# Patient Record
Sex: Female | Born: 1985
Health system: Southern US, Community
[De-identification: ages and names within clinical notes are randomized; demographics above are authoritative.]

## PROBLEM LIST (undated history)

## (undated) DIAGNOSIS — D649 Anemia, unspecified: Secondary | ICD-10-CM

## (undated) HISTORY — DX: Anemia, unspecified: D64.9

## (undated) HISTORY — PX: NO PAST SURGERIES: SHX2092

---

## 2019-09-14 ENCOUNTER — Encounter: Payer: Self-pay | Admitting: Medical

## 2019-09-14 ENCOUNTER — Other Ambulatory Visit (HOSPITAL_COMMUNITY)
Admission: RE | Admit: 2019-09-14 | Discharge: 2019-09-14 | Disposition: A | Discharge status: Home / Self Care | Payer: Medicaid Other | Source: Ambulatory Visit | Attending: Medical | Admitting: Medical

## 2019-09-14 ENCOUNTER — Other Ambulatory Visit: Payer: Self-pay

## 2019-09-14 ENCOUNTER — Ambulatory Visit (INDEPENDENT_AMBULATORY_CARE_PROVIDER_SITE_OTHER): Payer: Medicaid Other | Admitting: Medical

## 2019-09-14 VITALS — BP 116/77 | HR 89 | Ht 63.0 in | Wt 178.8 lb

## 2019-09-14 DIAGNOSIS — Z23 Encounter for immunization: Secondary | ICD-10-CM

## 2019-09-14 DIAGNOSIS — S2231XG Fracture of one rib, right side, subsequent encounter for fracture with delayed healing: Secondary | ICD-10-CM | POA: Diagnosis not present

## 2019-09-14 DIAGNOSIS — Z3689 Encounter for other specified antenatal screening: Secondary | ICD-10-CM | POA: Diagnosis not present

## 2019-09-14 DIAGNOSIS — Z3492 Encounter for supervision of normal pregnancy, unspecified, second trimester: Secondary | ICD-10-CM | POA: Diagnosis not present

## 2019-09-14 DIAGNOSIS — Z3480 Encounter for supervision of other normal pregnancy, unspecified trimester: Secondary | ICD-10-CM

## 2019-09-14 DIAGNOSIS — Z3A16 16 weeks gestation of pregnancy: Secondary | ICD-10-CM

## 2019-09-14 DIAGNOSIS — S2239XA Fracture of one rib, unspecified side, initial encounter for closed fracture: Secondary | ICD-10-CM | POA: Insufficient documentation

## 2019-09-14 DIAGNOSIS — S2231XD Fracture of one rib, right side, subsequent encounter for fracture with routine healing: Secondary | ICD-10-CM

## 2019-09-14 MED ORDER — BLOOD PRESSURE KIT DEVI: 1.0000 | Freq: Every day | 0 refills | Status: AC

## 2019-09-14 MED ORDER — PRENATAL VITAMIN 27-0.8 MG PO TABS: 1.0000 | ORAL_TABLET | Freq: Every day | ORAL | 11 refills | Status: AC

## 2019-09-14 NOTE — Progress Notes (Signed)
   PRENATAL VISIT NOTE  Subjective:  Kristen Perez is a 34 y.o. [phone] at 91w1dbeing seen today for her first prenatal visit for this pregnancy.  She is currently monitored for the following issues for this low-risk pregnancy and has Supervision of other normal pregnancy, antepartum and Rib fracture on their problem list.  Patient reports no complaints.  Contractions: Not present. Vag. Bleeding: None.  Movement: Absent. Denies leaking of fluid.   She is planning to breastfeed. Desires contraception, unsure what type  The following portions of the patient's history were reviewed and updated as appropriate: allergies, current medications, past family history, past medical history, past social history, past surgical history and problem list.   Objective:   Vitals:   09/14/19 0915 09/14/19 0916  BP: 116/77   Pulse: 89   Weight: 178 lb 12.8 oz (81.1 kg)   Height:  _0  (1.6 m)    Fetal Status: Fetal Heart Rate (bpm): 151   Movement: Absent     General:  Alert, oriented and cooperative. Patient is in no acute distress.  Skin: Skin is warm and dry. No rash noted.   Cardiovascular: Normal heart rate and rhythm noted  Respiratory: Normal respiratory effort, no problems with respiration noted. Clear to auscultation.   Abdomen: Soft, gravid, appropriate for gestational age. Normal bowel sounds. Non-tender. Pain/Pressure: Absent     Pelvic: Cervical exam deferred        Extremities: Normal range of motion.  Edema: None  Mental Status: Normal mood and affect. Normal behavior. Normal judgment and thought content.   Assessment and Plan:  Pregnancy: [id number] [redacted]w[redacted]d. Encounter for supervision of low-risk pregnancy in second trimester - Obstetric Panel, Including HIV - Genetic Screening - Hemoglobin A1c - GC/Chlamydia probe amp ([place])[facility] at [facility] AM[facility] BABYSCRIPTS SCHEDULE OPTIMIZATION - Culture, OB Urine - Blood Pressure Monitoring (BLOOD PRESSURE KIT) DEVI; 1 Device by  Does not apply route daily. ICD 10: Z34.00  Dispense: 1 each; Refill: 0 - Flu Vaccine QUAD 36+ mos IM - Prenatal Vit-Fe Fumarate-FA (PRENATAL VITAMIN) 27-0.8 MG TABS; Take 1 tablet by mouth daily.  Dispense: 30 tablet; Refill: 11 - AFP, Serum, Open Spina Bifida  2. Encounter for fetal anatomic survey - [place] OB COMP + 14 WK; Future  4. Closed fracture of one rib of right side with routine healing, subsequent encounter - Injury occurred in 2018. Patient continues to have intermittent pain at the site.   The nature of our practice with multiple providers was discussed  The care team including students, residents, APPs, and Physicians was discussed  The hospital call schedule for deliveries was discussed   Preterm labor symptoms and general obstetric precautions including but not limited to vaginal bleeding, contractions, leaking of fluid and fetal movement were reviewed in detail with the patient. Please refer to After Visit Summary for other counseling recommendations.   Return in about 4 weeks (around 10/12/2019) for LOB, Virtual.  Future Appointments  Date Time Provider DeMount Sterling3/23/2021 11:15 AM [facility] [place]  JuKerry HoughPA-C

## 2019-09-14 NOTE — Patient Instructions (Addendum)
Safe Medications in Pregnancy   Acne:  Benzoyl Peroxide  Salicylic Acid   Backache/Headache:  Tylenol: 2 regular strength every 4 hours OR        2 Extra strength every 6 hours   Colds/Coughs/Allergies:  Benadryl (alcohol free) 25 mg every 6 hours as needed  Breath right strips  Claritin  Cepacol throat lozenges  Chloraseptic throat spray  Cold-Eeze- up to three times per day  Cough drops, alcohol free  Flonase (by prescription only)  Guaifenesin  Mucinex  Robitussin DM (plain only, alcohol free)  Saline nasal spray/drops  Sudafed (pseudoephedrine) & Actifed * use only after [redacted] weeks gestation and if you do not have high blood pressure  Tylenol  Vicks Vaporub  Zinc lozenges  Zyrtec   Constipation:  Colace  Ducolax suppositories  Fleet enema  Glycerin suppositories  Metamucil  Milk of magnesia  Miralax  Senokot  Smooth move tea   Diarrhea:  Kaopectate  Imodium A-D   *NO pepto Bismol   Hemorrhoids:  Anusol  Anusol HC  Preparation H  Tucks   Indigestion:  Tums  Maalox  Mylanta  Zantac  Pepcid   Insomnia:  Benadryl (alcohol free) 25mg  every 6 hours as needed  Tylenol PM  Unisom, no Gelcaps   Leg Cramps:  Tums  MagGel   Nausea/Vomiting:  Bonine  Dramamine  Emetrol  Ginger extract  Sea bands  Meclizine  Nausea medication to take during pregnancy:  Unisom (doxylamine succinate 25 mg tablets) Take one tablet daily at bedtime. If symptoms are not adequately controlled, the dose can be increased to a maximum recommended dose of two tablets daily (1/2 tablet in the morning, 1/2 tablet mid-afternoon and one at bedtime).  Vitamin B6 100mg  tablets. Take one tablet twice a day (up to 200 mg per day).   Skin Rashes:  Aveeno products  Benadryl cream or 25mg  every 6 hours as needed  Calamine Lotion  1% cortisone cream   Yeast infection:  Gyne-lotrimin 7  Monistat 7    **If taking multiple medications, please check labels to avoid  duplicating the same active ingredients  **take medication as directed on the label  ** Do not exceed 4000 mg of tylenol in 24 hours  **Do not take medications that contain aspirin or ibuprofen         AREA PEDIATRIC/FAMILY PRACTICE PHYSICIANS  [place] ( ) . [facility] [place]; [id number], MD; [facility], MD; Melodie Bouillon, MD; McDiarmid, MD; Lum Babe, MD; Sheffield Slider, MD; Leveda Anna, MD o [address] [place] [place], [address] [place] o [phone] o Mon-Fri 8:30-12:30, 1:30-5:00 o Providers come to see babies at [facility] o Accepting Medicaid . [facility] [place] o Limited providers who accept newborns: [phone], MD; [facility], MD; [place], MD o [address], [place], Paulino Rily [address] o [phone] o Mon-Fri 8:00-5:30 o Babies seen by providers at [facility] o Does NOT accept Medicaid o Please call early in hospitalization for appointment (limited availability)  . [facility] [phone], MD o [address], [place], Fatima Sanger [address] o [phone] o Mon, Tue, Thur, Fri 8:30-5:00, Wed 10:00-7:00 (closed 1-2pm) o Babies seen by [facility] providers o Accepting Medicaid . 02-19-1968 - Pediatrician 07-09-1997, MD o [address], [place], Fae Pippin [place] o [phone] o Mon-Fri 8:30-5:00, Sat 8:30-12:00 o Provider comes to see babies at [facility] o Accepting Medicaid o Must have been referred from current patients or contacted office  prior to delivery . [facility]) Leotis Pain, MD; Ave Filter, MD; Luna Fuse, MD; Kennedy Bucker, MD; Konrad Dolores, MD; Kathlene November, MD; Jenne Campus, MD; Lubertha South, MD; Wynetta Emery, MD; Duffy Rhody, MD; Gerre Couch, NP; Shirl Harris, NP o [address] [place], [place], [place] [address] o [phone] o Mon, Tue, Thur, Fri 8:30-5:30, Wed 9:30-5:30, Sat 8:30-12:30 o Babies seen by [facility]  providers o Accepting Medicaid o Only accepting infants of first-time parents or siblings of current patients [facility] discharge coordinator will make follow-up appointment . Cyril Mourning o [address] [place], [place]  [address] o [phone]   Fax - [phone] . [facility] o [address], [place], [place]  [address] o Phone - [phone]   Fax - [phone] . Lucio Edward o [address], [place], [place]  [address] o [phone]  [place] [phone]) . [facility] Jorge Mandril, MD; Alita Chyle, MD; Princella Ion, MD; MD; Earlene Plater, MD; Jamesetta Orleans, MD; Alvera Novel, MD; Clarene Duke, MD; Rana Snare, MD; Carmon Ginsberg, MD; Alinda Money, MD; Hosie Poisson, MD; Mayford Knife, MD o [address] [place], [place] [address] o [phone] o Mon-Fri 8:30-5:00 (extended evenings Mon-Thur as needed), Sat-Sun 10:00-1:00 o Providers come to see babies at [facility] o Accepting Medicaid for families of first-time babies and families with all children in the household age 47 and under. Must register with office prior to making appointment (M-F only). [facility] Odella Aquas, NP; Lynelle Doctor, MD; Susann Givens, MD; [place], [place] o [address], [place], [place] [address] o [phone] o Mon-Fri 8:00-5:00 o Babies seen by providers at [facility] o Does NOT accept Medicaid/Commercial Insurance Only . [facility] [place] ([facility])  Suzette Battiest, MD; Zachery Dauer, MD; Stefan Church, MD; Sabino Dick, MD; Quitman Livings, MD; Farris Has, MD; Gaynell Face, MD; Betha Loa, MD; Colon Flattery, MD; Clifton James, MD o [address] [place] [place], [place] [address] o [phone] o Mon-Fri 8:30-5:30, Sat (Oct.-Mar.) 9:00-1:00 o Babies seen by providers at [facility] o Accepting [facility] [phone]) . [facility] Dimitri, MD; Sheliah Hatch, MD o [address], [place], [place] [address] o [phone] o Mon-Fri 8:30-5:00, Sat  8:30-12:00 o Providers come to see babies at [facility] o Does NOT accept Medicaid . [facility] Cindy Hazy, [place]; Frannie, MD; [place], [place]; Wynelle Link, MD; Azucena Cecil, MD o [address], [place], [place] [address] o [phone] o Mon-Fri 8:00-5:00 o Babies seen by providers at [facility] o Does NOT accept Medicaid o Only accepting babies of parents who are patients o Please call early in hospitalization for appointment (limited availability) . [facility] Lamar Benes, MD; Abran Cantor, MD; Early Osmond, MD; Cherre Huger, NP; Hyacinth Meeker, MD; Dwan Bolt, MD; Jarold Motto, NP; Dario Guardian, MD; Talmage Nap, MD; Maisie Fus, MD; Pricilla Holm, MD; Tama High, MD o [address] [place], [place], [place] [address] o [phone] o Mon-Fri 8:00-5:00, Sat 9:00-12:00 o Providers come to see babies at [facility] o Does NOT accept [facility] [phone]) . [facility] o Limited providers accepting new patients: Drema Pry, NP; [place], [place] o [address], [place], [place] [address] o [phone] o Mon-Fri 8:00-5:00 o Babies seen by providers at [facility] o Does NOT accept Medicaid o Only accepting babies of parents who are patients o Please call early in hospitalization for appointment (limited availability) . [facility] Luan Pulling, MD; Nash Dimmer, MD o [address] [place] [address] [place], [place] [address]  o [phone] (press 1 to schedule appointment) o Mon-Fri 8:00-5:00 o Providers come to see babies at [facility] o Does NOT accept Medicaid . [facility] Cristino Martes, MD o [address], [place], [place] [address] o [phone] o Mon-Fri 8:30-5:00 (lunch 12:30-1:00), extended hours by appointment only Wed 5:00-6:30 o Babies seen by [facility] providers o Accepting Medicaid . [facility] Abbot, MD; [place], MD; Hassan Rowan, MD o [address] [place], [place], [place] [address] o [phone] o Mon-Fri  8:00-5:00 o Babies seen by [facility] providers o Does NOT accept Medicaid . Nature conservation officer at [place] [address] Elsworth Soho, MD; Durene Cal, MD; [place], DO o [address] [place], [place] [address] o [phone] o Mon-Fri 8:00-5:00 o Babies seen by [facility] providers o Does NOT accept Medicaid . [facility] o [place], [place]; [place], [place]; [place], NP; Avis Epley, MD; Vonna Kotyk, MD; Clance Boll, MD; Stevphen Rochester, NP; Arvilla Market, NP; Ann Maki, NP; Otis Dials, NP; Vaughan Basta, MD; [place], MD o [address] [place], [place] [address] o [phone] o Mon-Fri 8:30-5:00, Sat 10:00-1:00 o Providers come to see babies at [facility] o Does NOT accept Medicaid o Free prenatal information session Tuesdays at 4:45pm . [facility] Luna Kitchens, MD; [place], [place]; [place], [place]; [place], [place] o [address] [place] [address] o [phone] o Mon-Fri 7:30-5:30 o Babies seen by [facility] providers . [facility] Children[facility] Doctor o [address], [place], [place]  [address] o [phone]   Fax - [phone]  [place] [phone] & [phone]) . [facility] Alphonsa Overall, MD o [address] [place], [place] [address] o [phone] o Mon-Thur 8:00-6:00 o Providers come to see babies at [facility] o Accepting Medicaid . [facility] Zenon Mayo, NP; Cyndia Bent, MD; [place], [place]; [place], [place] o [address] [place], [place] [address] o [phone] o Mon-Thur 7:30-7:30, Fri 7:30-4:30 o Babies seen by [facility] providers o Accepting Medicaid . [facility] Cheryle Hors[place], MD; Janene Harvey, NP; Vonita Moss, MD o [address], [place], [place] [address] o [phone] o Mon-Fri 8:30-5:00, Sat 8:30-12:00 o Providers come to see babies at [facility] o Accepting Medicaid o Must have "Meet & Greet" appointment at office prior to delivery . [facility] [place] ([facility]) Llana Aliment, MD; Earlene Plater, MD; Lucretia Roers, MD o [address], [place], [place] [address] o [phone] o Mon-Wed 8:00-6:00, Thur-Fri 8:00-5:00, Sat 9:00-12:00 o Providers come to see babies at [facility] o Does NOT accept Medicaid o Only accepting siblings of current patients . [facility] [place]  o [address], [place], [place]  [address] o [phone]   Fax - [phone] . [facility] at [facility] o [phone] [address], [place], [place]  [address] o [phone]   Fax - [phone]  [place] [phone] & [phone]) . Nature conservation officer at [facility] o [place], DO; [place], DO o [address] [place], [place] [address] o [phone] o Mon-Fri 7:00-5:00 o Babies seen by [facility] providers o Does NOT accept Medicaid . [facility] Ellis Savage, MD; [place], [place]; [employer], [place] o [address], [place], [place] [address] o [phone] o Mon-Fri 8:00-5:00 o Babies seen by [facility] providers o Accepting Medicaid . [facility] - [address] Franne Forts, MD; [place], [place]; Micco, NP; [place], [place] o [address] [place], [place], [place] [address]  o [phone] o Mon-Fri 8:00-5:00 o Babies seen by providers at [facility] o Accepting [facility] [phone]) . [facility] [place], [place] o [address] [place], [place] [address] o [phone] o Mon-Fri 8:00-5:00 o Babies seen by [facility] providers o Does NOT accept Medicaid o Limited availability, please call early in hospitalization to schedule follow-up . [facility] Jolee Ewing, PA; Eddie Candle, MD; [place], MD; [place], [place]; Constance Goltz, MD; [place], [place] o [facility] [address], [place], [place] [address] o [phone] o Mon-Fri 8:30-5:00, Sat 9:00-12:00 o Babies seen by providers at  [facility] o Accepting Medicaid o Please register online then schedule online or call office o [email or web address] . [facility]) Samuella Bruin, NP; Lucianne Muss, MD; Lanier Clam, PA o [address], [place], [place] [address] o [phone] o Mon-Fri 8:00-5:00 o Babies seen by providers at [facility] o Accepting Medicaid . [facility] [place] ([facility]) Sharin Mons, MD; Reed Breech, NP; Shelva Majestic, MD o [address], [place], [place] [address] o [phone] o Mon-Fri 8:00-5:30, Sat&Sun by appointment (phones open at 8:30) o Babies seen by [facility] providers o Accepting Medicaid o Must be a first-time baby or sibling of current patient . [facility] [address], [place], [place]  [address] o [phone]   Fax - [phone]  [place] [phone] & [phone]) . [facility] Medicine o [place], [place]; [place], [place]; Dimple Casey, MD; [place], [place]; Carolyne Fiscal, MD o [address] [place], [place] [address] o [phone] o Mon-Thur 8:00-7:00, Fri 8:00-5:00, Sat 8:00-12:00, Sun 9:00-12:00 o Babies seen by [facility] providers o Accepting Medicaid . [facility] at [facility], MD; Gaynell Face, MD; [facility], MD o [address], [place], [place] [address] o [phone] o Mon-Thur 8:00-5:00 o Babies seen by providers at [facility] o Accepting Medicaid . [facility] [place] Gwenlyn Saran, MD; [place]Go[place], MD; Madilyn Fireman, MD; Melvyn Neth, MD; List, MD; Lazarus Salines, MD; Gaynell Face, MD; Berneda Rose, MD; Flora Lipps, MD; Beryl Meager, MD; Luther Redo, MD; Lavonia Drafts, MD; Kellie Simmering, MD o [address] [place] [place], [place] [address] o [phone] o Mon-Fri 8:00-5:30, Sat (Oct.-Mar.) 9:00-1:00 o Babies seen by providers at [facility] o Accepting Medicaid o Must fill out  new patient packet, available online at [email or web address] . [facility] [place] [facility] at [facility]) Simone Curia, NP; Tiburcio Pea, NP; Tresa Endo, NP; Whitney Post, MD; [place], [place]; Hennie Duos, MD; Wynne Dust, MD; Kavin Leech, NP o [address], [place], [place] [address] o [phone] o Mon-Thur 8:00-5:30, Fri 8:00-5:00 o Babies seen by providers at [facility] o Accepting [facility] [phone]) . [facility] o [place], [place]; [place], MD; Tanya Nones, MD; [place], [place] o [address] [place], [place] [address] o [phone] o Mon-Fri 8:00-5:00 o Babies seen by providers at [facility] o Accepting [facility] [phone]) . [facility] at [facility] o [place], DO; Lenise Arena, MD; [place], [place] o [address], [place], [place] [address] o [phone] o Mon-Fri 8:00-5:00 o Babies seen by providers at [facility] o Does NOT accept Medicaid o Limited appointment availability, please call early in hospitalization  . Nature conservation officer at [facility] o [place], [place];  [place], MD o [address], [place], [place] [address] o [phone] o Mon-Fri 8:00-5:00 o Babies seen by [facility] providers o Does NOT accept Medicaid . [facility] [place] o Cameron, MD; MacDonald, MD; [place], PA; Nayak, MD o [address] [place] [address], [place], [place] [address] o [phone] o Mon-Fri 8:00-5:00 o After hours clinic ([address] [place], [place] [address]) [phone] Mon-Fri 5:00-8:00, Sat 12:00-6:00, Sun 10:00-4:00 o Babies seen by [facility] providers o Accepting Medicaid . [facility] [place] o [address], [place], [place]  [address] o [phone]   Fax - [phone]  [place] ([phone]) . [facility] [place] o Andy, MD o [address], [place], [place] [address] o [phone] o Mon-Fri 8:00-5:00 o [facility] by [facility] providers o Does NOT accept Medicaid . [facility] [place] ([facility] [place]) o Eksir, MD o [address], [place], [place] [address] o [phone] o Mon-Thur 8:00-7:00, Fri 8:00-5:00, Sat 8:00-12:00 o Babies seen by providers at [facility] o Accepting Medicaid - but does not have vaccinations in office (must be received elsewhere) o Limited availability, please call early in hospitalization  [place] ([phone]) . [facility]  o Charlene Flemming, MD o [address], [place] [address] o [phone]  Fax [phone]   

## 2019-09-15 LAB — OBSTETRIC PANEL, INCLUDING HIV
Antibody Screen: NEGATIVE
Basophils Absolute: 0 10*3/uL (ref 0.0–0.2)
Basos: 0 %
EOS (ABSOLUTE): 0 10*3/uL (ref 0.0–0.4)
Eos: 0 %
HIV Screen 4th Generation wRfx: NONREACTIVE
Hematocrit: 33.2 % — ABNORMAL LOW (ref 34.0–46.6)
Hemoglobin: 11.4 g/dL (ref 11.1–15.9)
Hepatitis B Surface Ag: NEGATIVE
Immature Grans (Abs): 0 10*3/uL (ref 0.0–0.1)
Immature Granulocytes: 1 %
Lymphocytes Absolute: 1.3 10*3/uL (ref 0.7–3.1)
Lymphs: 17 %
MCH: 28.4 pg (ref 26.6–33.0)
MCHC: 34.3 g/dL (ref 31.5–35.7)
MCV: 83 fL (ref 79–97)
Monocytes Absolute: 0.6 10*3/uL (ref 0.1–0.9)
Monocytes: 8 %
Neutrophils Absolute: 6 10*3/uL (ref 1.4–7.0)
Neutrophils: 74 %
Platelets: 294 10*3/uL (ref 150–450)
RBC: 4.01 x10E6/uL (ref 3.77–5.28)
RDW: 13.6 % (ref 11.7–15.4)
RPR Ser Ql: NONREACTIVE
Rh Factor: POSITIVE
Rubella Antibodies, IGG: 0.99 index — ABNORMAL LOW (ref >0.99)
WBC: 8.1 10*3/uL (ref 3.4–10.8)

## 2019-09-15 LAB — GC/CHLAMYDIA PROBE AMP (~~LOC~~) NOT AT ARMC
Chlamydia: NEGATIVE
Comment: NEGATIVE
Comment: NORMAL
Neisseria Gonorrhea: NEGATIVE

## 2019-09-15 LAB — HEMOGLOBIN A1C
Est. average glucose Bld gHb Est-mCnc: 97 mg/dL
Hgb A1c MFr Bld: 5 % (ref 4.8–5.6)

## 2019-09-16 ENCOUNTER — Encounter: Payer: Self-pay | Admitting: *Deleted

## 2019-09-16 ENCOUNTER — Encounter: Payer: Self-pay | Admitting: Medical

## 2019-09-16 DIAGNOSIS — Z283 Underimmunization status: Secondary | ICD-10-CM | POA: Insufficient documentation

## 2019-09-16 DIAGNOSIS — Z2839 Other underimmunization status: Secondary | ICD-10-CM | POA: Insufficient documentation

## 2019-09-16 LAB — AFP, SERUM, OPEN SPINA BIFIDA
AFP MoM: 0.95
AFP Value: 31.7 ng/mL
Gest. Age on Collection Date: 16.1 weeks
Maternal Age At EDD: 32.9 yr
OSBR Risk 1 IN: 10000
Test Results:: NEGATIVE
Weight: 178 [lb_av]

## 2019-09-16 LAB — CULTURE, OB URINE

## 2019-09-16 LAB — URINE CULTURE, OB REFLEX

## 2019-09-21 ENCOUNTER — Encounter: Payer: Self-pay | Admitting: Medical

## 2019-09-24 ENCOUNTER — Encounter: Payer: Self-pay | Admitting: Medical

## 2019-09-27 ENCOUNTER — Encounter: Payer: Self-pay | Admitting: *Deleted

## 2019-09-30 ENCOUNTER — Telehealth (INDEPENDENT_AMBULATORY_CARE_PROVIDER_SITE_OTHER): Payer: Medicaid Other | Admitting: Lactation Services

## 2019-09-30 DIAGNOSIS — Z3480 Encounter for supervision of other normal pregnancy, unspecified trimester: Secondary | ICD-10-CM

## 2019-09-30 NOTE — Telephone Encounter (Signed)
Called patient to inform her that her Horizon results shows she is a silent carrier for Alpha Thalassemia and increased carrier risk for SMA. Advised patient that it is recommended that she call [facility] at [phone] to set up genetic counseling session. Discussed they most likely will recommend that partner be tested also.   Questions answered and patient to call [facility] to follow up. Patient reports her grandmother had a blood disorder that required her to need blood transfusions.

## 2019-10-06 ENCOUNTER — Encounter: Payer: Self-pay | Admitting: *Deleted

## 2019-10-12 ENCOUNTER — Encounter: Payer: Self-pay | Admitting: Advanced Practice Midwife

## 2019-10-12 ENCOUNTER — Other Ambulatory Visit: Payer: Self-pay

## 2019-10-12 ENCOUNTER — Telehealth (INDEPENDENT_AMBULATORY_CARE_PROVIDER_SITE_OTHER): Payer: Medicaid Other | Admitting: Advanced Practice Midwife

## 2019-10-12 DIAGNOSIS — Z3A20 20 weeks gestation of pregnancy: Secondary | ICD-10-CM

## 2019-10-12 DIAGNOSIS — O99891 Other specified diseases and conditions complicating pregnancy: Secondary | ICD-10-CM | POA: Diagnosis not present

## 2019-10-12 DIAGNOSIS — Z3480 Encounter for supervision of other normal pregnancy, unspecified trimester: Secondary | ICD-10-CM

## 2019-10-12 DIAGNOSIS — O09899 Supervision of other high risk pregnancies, unspecified trimester: Secondary | ICD-10-CM

## 2019-10-12 DIAGNOSIS — Z283 Underimmunization status: Secondary | ICD-10-CM

## 2019-10-12 NOTE — Progress Notes (Signed)
   TELEHEALTH VIRTUAL OBSTETRICS VISIT ENCOUNTER NOTE  I connected with Kristen Perez on 10/12/19 at 11:15 AM EDT by telephone at home and verified that I am speaking with the correct person using two identifiers.   I discussed the limitations, risks, security and privacy concerns of performing an evaluation and management service by telephone and the availability of in person appointments. I also discussed with the patient that there may be a patient responsible charge related to this service. The patient expressed understanding and agreed to proceed.  Subjective:  Kristen Perez is a 34 y.o. [phone] at [redacted]w[redacted]d being followed for ongoing prenatal care.  She is currently monitored for the following issues for this low-risk pregnancy and has Supervision of other normal pregnancy, antepartum; Rib fracture; and Rubella non-immune status, antepartum on their problem list.  Patient reports no complaints. Reports fetal movement. Denies any contractions, bleeding or leaking of fluid.   The following portions of the patient's history were reviewed and updated as appropriate: allergies, current medications, past family history, past medical history, past social history, past surgical history and problem list.   Objective:   General:  Alert, oriented and cooperative.   Mental Status: Normal mood and affect perceived. Normal judgment and thought content.  Rest of physical exam deferred due to type of encounter  Assessment and Plan:  Pregnancy: G5P3013 at [redacted]w[redacted]d 1. Rubella non-immune status, antepartum  2. Supervision of other normal pregnancy, antepartum - Order placed for [place]. Need to schedule [place] visit ASAP.   Preterm labor symptoms and general obstetric precautions including but not limited to vaginal bleeding, contractions, leaking of fluid and fetal movement were reviewed in detail with the patient.  I discussed the assessment and treatment plan with the patient. The patient was provided an  opportunity to ask questions and all were answered. The patient agreed with the plan and demonstrated an understanding of the instructions. The patient was advised to call back or seek an in-person office evaluation/go to [facility]U at [facility] for any urgent or concerning symptoms. Please refer to After Visit Summary for other counseling recommendations.   I provided 10 minutes of non-face-to-face time during this encounter.  Return in about 4 weeks (around 11/09/2019) for virtual visit .  No future appointments.  Thressa Sheller DNP, CNM  10/12/19  11:42 AM  Center for [employer], [facility]

## 2019-10-12 NOTE — Progress Notes (Signed)
I connected with  Kristen Perez on 10/12/19 at 11:15 AM EDT by telephone and verified that I am speaking with the correct person using two identifiers.   I discussed the limitations, risks, security and privacy concerns of performing an evaluation and management service by telephone and the availability of in person appointments. I also discussed with the patient that there may be a patient responsible charge related to this service. The patient expressed understanding and agreed to proceed.  Kristen Perez, CMA 10/12/2019  11:17 AM  Currently on a Train will record BP later on and send through mychart.

## 2019-10-19 ENCOUNTER — Other Ambulatory Visit: Payer: Self-pay | Admitting: Advanced Practice Midwife

## 2019-10-19 ENCOUNTER — Other Ambulatory Visit (HOSPITAL_COMMUNITY): Payer: Self-pay | Admitting: *Deleted

## 2019-10-19 ENCOUNTER — Other Ambulatory Visit: Payer: Self-pay

## 2019-10-19 ENCOUNTER — Ambulatory Visit (HOSPITAL_COMMUNITY)
Admission: RE | Admit: 2019-10-19 | Discharge: 2019-10-19 | Disposition: A | Discharge status: Home / Self Care | Payer: Medicaid Other | Source: Ambulatory Visit | Attending: Obstetrics and Gynecology | Admitting: Obstetrics and Gynecology

## 2019-10-19 DIAGNOSIS — O359XX0 Maternal care for (suspected) fetal abnormality and damage, unspecified, not applicable or unspecified: Secondary | ICD-10-CM

## 2019-10-19 DIAGNOSIS — Z148 Genetic carrier of other disease: Secondary | ICD-10-CM | POA: Diagnosis not present

## 2019-10-19 DIAGNOSIS — Z363 Encounter for antenatal screening for malformations: Secondary | ICD-10-CM | POA: Diagnosis not present

## 2019-10-19 DIAGNOSIS — O99212 Obesity complicating pregnancy, second trimester: Secondary | ICD-10-CM | POA: Diagnosis not present

## 2019-10-19 DIAGNOSIS — Z3A21 21 weeks gestation of pregnancy: Secondary | ICD-10-CM

## 2019-10-19 DIAGNOSIS — Z3480 Encounter for supervision of other normal pregnancy, unspecified trimester: Secondary | ICD-10-CM | POA: Diagnosis present

## 2019-10-19 DIAGNOSIS — D563 Thalassemia minor: Secondary | ICD-10-CM

## 2019-11-02 ENCOUNTER — Ambulatory Visit (HOSPITAL_COMMUNITY): Payer: Medicaid Other | Attending: Obstetrics and Gynecology

## 2019-11-02 ENCOUNTER — Encounter (HOSPITAL_COMMUNITY): Payer: Self-pay

## 2019-11-08 ENCOUNTER — Encounter: Payer: Self-pay | Admitting: *Deleted

## 2019-11-09 ENCOUNTER — Telehealth (INDEPENDENT_AMBULATORY_CARE_PROVIDER_SITE_OTHER): Payer: Medicaid Other | Admitting: Advanced Practice Midwife

## 2019-11-09 ENCOUNTER — Encounter: Payer: Self-pay | Admitting: *Deleted

## 2019-11-09 DIAGNOSIS — Z91199 Patient's noncompliance with other medical treatment and regimen due to unspecified reason: Secondary | ICD-10-CM

## 2019-11-09 DIAGNOSIS — Z5329 Procedure and treatment not carried out because of patient's decision for other reasons: Secondary | ICD-10-CM

## 2019-11-09 NOTE — Progress Notes (Signed)
Patient did not answer for her visit.   Thressa Sheller DNP, CNM  11/09/19  11:34 AM

## 2019-11-09 NOTE — Progress Notes (Signed)
Called pt @ 1104 for scheduled video visit and heard message stating that the person called could not be reached @ this time. Unable to leave a VM message. MyChart message was sent to pt.

## 2019-11-09 NOTE — Progress Notes (Signed)
@  1119am call can not be completed as dialed check the number and try again message displayed. Diane sent mychart message to patient.

## 2019-11-26 ENCOUNTER — Encounter: Payer: Medicaid Other | Admitting: Family Medicine

## 2019-11-26 ENCOUNTER — Encounter: Payer: Self-pay | Admitting: Family Medicine

## 2019-11-26 NOTE — Progress Notes (Signed)
Patient did not keep appointment today. She will be called to reschedule.  

## 2019-12-07 ENCOUNTER — Encounter: Payer: Medicaid Other | Admitting: Student

## 2019-12-27 ENCOUNTER — Encounter: Payer: Medicaid Other | Admitting: Nurse Practitioner

## 2020-02-14 ENCOUNTER — Ambulatory Visit (INDEPENDENT_AMBULATORY_CARE_PROVIDER_SITE_OTHER): Payer: Medicaid Other | Admitting: Obstetrics and Gynecology

## 2020-02-14 ENCOUNTER — Encounter: Payer: Self-pay | Admitting: *Deleted

## 2020-02-14 ENCOUNTER — Encounter: Payer: Self-pay | Admitting: Obstetrics and Gynecology

## 2020-02-14 ENCOUNTER — Other Ambulatory Visit: Payer: Self-pay

## 2020-02-14 ENCOUNTER — Other Ambulatory Visit (HOSPITAL_COMMUNITY)
Admission: RE | Admit: 2020-02-14 | Discharge: 2020-02-14 | Disposition: A | Discharge status: Home / Self Care | Payer: Medicaid Other | Source: Ambulatory Visit | Attending: Obstetrics and Gynecology | Admitting: Obstetrics and Gynecology

## 2020-02-14 VITALS — BP 119/79 | HR 89 | Wt 191.2 lb

## 2020-02-14 DIAGNOSIS — Z3480 Encounter for supervision of other normal pregnancy, unspecified trimester: Secondary | ICD-10-CM | POA: Diagnosis not present

## 2020-02-14 DIAGNOSIS — O0933 Supervision of pregnancy with insufficient antenatal care, third trimester: Secondary | ICD-10-CM

## 2020-02-14 DIAGNOSIS — Z23 Encounter for immunization: Secondary | ICD-10-CM | POA: Diagnosis not present

## 2020-02-14 DIAGNOSIS — Z3A38 38 weeks gestation of pregnancy: Secondary | ICD-10-CM | POA: Diagnosis not present

## 2020-02-14 LAB — GLUCOSE, CAPILLARY: Glucose-Capillary: 71 mg/dL (ref 70–99)

## 2020-02-14 NOTE — Patient Instructions (Signed)
Transportation Resources [place] ° °[place] [employer] ([employer]) °[address] [place], [place], [place] [address] °https://www.[place].gov/departments/transportation/gdot-divisions/[place]-transit-agency-public-transportation-division    ° °• Fixed-route bus services, including regional fare cards for PART, [employer], [employer], and [employer] buses.  °• Reduced fare bus ID's available for Medicaid, Medicare, and "orange card" recipients.  °• [employer] offers curb-to-curb and door-to-door bus services for people with disabilities who are unable to use a fixed-bus route; also offers a shared-ride program.  ° °Helpful tips:  °-Routes available online and physical maps available at the main bus hub lobby (each for a specific route) °-Smartphone directions often include bus routes (see the "bus" icon, next to the "car" and "walk" icons) °-Routes differ on weekends, evenings and holidays, so plan ahead!  °-If you have Medicaid, Medicare, or orange card, plan to obtain a reduced-fare ID to save 50% on rides. Check days and times to obtain an ID, and bring all necessary documents.  ° °[place] [employer] ([employer] °[address], [place], [place] [address] °(336)887-1183 °Https://www.[place].gov/471/Transit °**Fixed-bus route services, and demand response bus service for older adults ° °[facility] [employer] [facility] °[address], [place], [place] [address] °(336) 641-3447 °www.[place].gov/[place]/human-services/social-services °**Medicaid transportation is available to Medicaid recipients who need assistance getting to Medicaid-approved medical appointments and providers ° °[employer] °[address], [place], [place] [address] °www.cjmedicaltransportation.com  °** Offers non-emergency transportation for medical appointments ° °[employer] °[address], [place], [place] [address] °([phone])  355-9130 °www.wheels4hope.org °**REFERRAL NEEDED by specific agencies (see website), after meeting specified criteria only ° °[facility] [employer] [facility] [employer]([facility]) °[address], [place], [place] [address] °([phone]) [phone] ° www.partnc.org  °*Regional fixed-bus routes between counties (example: [place] to [place]) and Airport Shuttles ° °[facility] [place]  °[address], [place], [place] [address] °([phone]) 333-6981 °Http://senior-resources[place].org  °**Medical Transportation available (call or see website for details) ° °[facility] °[address], [place], [place] [address] °([phone]) [phone] °www.senioradults.org  °**Ride coordination ° ° ° °[employer] [facility] [place] ° °[facility] °[address], [place], [place] [address] °([phone]) 349-2343 °www.adtsrc.org  ° °[place] [facility] °[address], [place], [place] [address] °([phone]) [phone]  °https://www.[place].org/  °**Transportation expense assistance ° °[facility] °[address], [place], [place] [address] °(336) 342-1394 °www.[place]..us/pview.aspx?id=14850&catid=407  °**Medicaid transportation for recipients who need assistance getting to Medicaid-approved medical appointments and providers  ° ° ° ° ° If you are in need of transportation to get to and from your appointments in our office.  You can reach Transportation Services by calling [phone] Monday - Friday  7am-6pm. °

## 2020-02-14 NOTE — Progress Notes (Signed)
   LOW-RISK PREGNANCY OFFICE VISIT Patient name: Kristen Perez MRN [id number]  Date of birth: November 04, 1985 Chief Complaint:   Routine Prenatal Visit  History of Present Illness:   Kristen Perez is a 34 y.o. [id number] female at [redacted]w[redacted]d with an Estimated Date of Delivery: 02/28/20 being seen today for ongoing management of a low-risk pregnancy.  Today she reports occasional contractions "for the past 2 days." Contractions: Irregular. Vag. Bleeding: None.  Movement: Present. denies leaking of fluid. Review of Systems:   Pertinent items are noted in HPI Denies abnormal vaginal discharge w/ itching/odor/irritation, headaches, visual changes, shortness of breath, chest pain, abdominal pain, severe nausea/vomiting, or problems with urination or bowel movements unless otherwise stated above. Pertinent History Reviewed:  Reviewed past medical,surgical, social, obstetrical and family history.  Reviewed problem list, medications and allergies. Physical Assessment:   Vitals:   02/14/20 1318  BP: 119/79  Pulse: 89  Weight: 191 lb 3.2 oz (86.7 kg)  Body mass index is 33.87 kg/m.        Physical Examination:   General appearance: Well appearing, and in no distress  Mental status: Alert, oriented to person, place, and time  Skin: Warm & dry  Cardiovascular: Normal heart rate noted  Respiratory: Normal respiratory effort, no distress  Abdomen: Soft, gravid, nontender  Pelvic: Cervical exam performed         Extremities: Edema: Trace  Fetal Status: Fetal Heart Rate (bpm): 148 Fundal Height: 40 cm Movement: Present Presentation: Vertex  Results for orders placed or performed in visit on 02/14/20 (from the past 24 hour(s))  Glucose, capillary   Collection Time: 02/14/20  1:41 PM  Result Value Ref Range   Glucose-Capillary 71 70 - 99 mg/dL    Assessment & Plan:  1) Low-risk pregnancy G5P3013 at [redacted]w[redacted]d with an Estimated Date of Delivery: 02/28/20   2) Supervision of other normal pregnancy, antepartum    - GC/Chlamydia probe amp ([place])not at [facility],  - Culture, beta strep (group b only),  - CBC,  - RPR,  - Tdap vaccine greater than or equal to 7yo IM,  - Unable to complete 2 hr GTT today, Hemoglobin A1c  3) Limited prenatal care in third trimester - Patient reports the hospital where she had prior babies did not have availabilities for >1 month while she was in [place]   Meds: No orders of the defined types were placed in this encounter.  Labs/procedures today: CBC, RPR, Random CBG, HgB A1C  Plan:  Continue routine obstetrical care   Reviewed: Term labor symptoms and general obstetric precautions including but not limited to vaginal bleeding, contractions, leaking of fluid and fetal movement were reviewed in detail with the patient.  All questions were answered.   Follow-up: Return in about 1 week (around 02/21/2020) for Return OB visit.  Orders Placed This Encounter  Procedures  . Culture, beta strep (group b only)  . Tdap vaccine greater than or equal to 7yo IM  . CBC  . RPR  . Hemoglobin A1c  . Glucose, capillary   Raelyn Mora MSN, CNM 02/14/2020

## 2020-02-14 NOTE — Addendum Note (Signed)
Addended by: Gerome Apley on: 02/14/2020 02:31 PM   Modules accepted: Orders

## 2020-02-14 NOTE — Progress Notes (Addendum)
Has not had prenatal visit since April. States was in [place] and could not get prenatal care there. Has checked blood when she thought it was high ;but states was ok.   Does have transportation issues. Given transportation resources in [place] and interested in [place] [facility] transportation. Transportation consent signed and form submitted.  Not fasting today; unable to do 2 hr gtt. Fingerstick done per provider.  Rodolfo Notaro,RN

## 2020-02-15 LAB — CBC
Hematocrit: 30.7 % — ABNORMAL LOW (ref 34.0–46.6)
Hemoglobin: 10.1 g/dL — ABNORMAL LOW (ref 11.1–15.9)
MCH: 27.7 pg (ref 26.6–33.0)
MCHC: 32.9 g/dL (ref 31.5–35.7)
MCV: 84 fL (ref 79–97)
Platelets: 203 10*3/uL (ref 150–450)
RBC: 3.65 x10E6/uL — ABNORMAL LOW (ref 3.77–5.28)
RDW: 13.5 % (ref 11.7–15.4)
WBC: 6.6 10*3/uL (ref 3.4–10.8)

## 2020-02-15 LAB — GC/CHLAMYDIA PROBE AMP (~~LOC~~) NOT AT ARMC
Chlamydia: NEGATIVE
Comment: NEGATIVE
Comment: NORMAL
Neisseria Gonorrhea: NEGATIVE

## 2020-02-15 LAB — HIV ANTIBODY (ROUTINE TESTING W REFLEX): HIV Screen 4th Generation wRfx: NONREACTIVE

## 2020-02-15 LAB — RPR: RPR Ser Ql: NONREACTIVE

## 2020-02-15 LAB — HEMOGLOBIN A1C
Est. average glucose Bld gHb Est-mCnc: 103 mg/dL
Hgb A1c MFr Bld: 5.2 % (ref 4.8–5.6)

## 2020-02-17 LAB — CULTURE, BETA STREP (GROUP B ONLY): Strep Gp B Culture: NEGATIVE

## 2020-02-23 ENCOUNTER — Ambulatory Visit (INDEPENDENT_AMBULATORY_CARE_PROVIDER_SITE_OTHER): Payer: Medicaid Other | Admitting: Nurse Practitioner

## 2020-02-23 ENCOUNTER — Other Ambulatory Visit: Payer: Self-pay

## 2020-02-23 VITALS — BP 126/83 | HR 97 | Wt 190.0 lb

## 2020-02-23 DIAGNOSIS — R0781 Pleurodynia: Secondary | ICD-10-CM

## 2020-02-23 DIAGNOSIS — Z3480 Encounter for supervision of other normal pregnancy, unspecified trimester: Secondary | ICD-10-CM

## 2020-02-23 DIAGNOSIS — Z3A39 39 weeks gestation of pregnancy: Secondary | ICD-10-CM
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
## 2020-02-23 NOTE — Progress Notes (Signed)
IOL paperwork faxed to L&D.  Received successful transmission.    Addison Naegeli, RN

## 2020-02-24 ENCOUNTER — Encounter (HOSPITAL_COMMUNITY): Payer: Self-pay | Admitting: *Deleted

## 2020-02-24 ENCOUNTER — Telehealth (HOSPITAL_COMMUNITY): Payer: Self-pay | Admitting: *Deleted

## 2020-02-24 NOTE — Addendum Note (Signed)
Addended by: Currie Paris on: 02/24/2020 03:58 PM   Modules accepted: Orders, SmartSet

## 2020-02-24 NOTE — Telephone Encounter (Signed)
Preadmission screen  

## 2020-02-26 ENCOUNTER — Other Ambulatory Visit (HOSPITAL_COMMUNITY)
Admission: RE | Admit: 2020-02-26 | Discharge: 2020-02-26 | Disposition: A | Discharge status: Home / Self Care | Payer: Medicaid Other | Source: Ambulatory Visit | Attending: Family Medicine | Admitting: Family Medicine

## 2020-02-26 DIAGNOSIS — Z01812 Encounter for preprocedural laboratory examination: Secondary | ICD-10-CM | POA: Diagnosis not present

## 2020-02-26 DIAGNOSIS — Z20822 Contact with and (suspected) exposure to covid-19: Secondary | ICD-10-CM | POA: Diagnosis not present

## 2020-02-26 LAB — SARS CORONAVIRUS 2 (TAT 6-24 HRS): SARS Coronavirus 2: NEGATIVE

## 2020-02-27 ENCOUNTER — Other Ambulatory Visit: Payer: Self-pay | Admitting: Family Medicine

## 2020-02-28 ENCOUNTER — Inpatient Hospital Stay (HOSPITAL_COMMUNITY): Payer: Medicaid Other

## 2020-02-28 ENCOUNTER — Inpatient Hospital Stay (HOSPITAL_COMMUNITY): Payer: Medicaid Other | Admitting: Anesthesiology

## 2020-02-28 ENCOUNTER — Encounter (HOSPITAL_COMMUNITY)
Admission: AD | Disposition: A | Discharge status: Home / Self Care | Payer: Self-pay | Source: Home / Self Care | Attending: Obstetrics & Gynecology

## 2020-02-28 ENCOUNTER — Inpatient Hospital Stay (HOSPITAL_COMMUNITY)
Admission: AD | Admit: 2020-02-28 | Discharge: 2020-03-02 | DRG: 788 | Disposition: A | Discharge status: Home / Self Care | Payer: Medicaid Other | Attending: Obstetrics & Gynecology | Admitting: Obstetrics & Gynecology

## 2020-02-28 ENCOUNTER — Other Ambulatory Visit: Payer: Self-pay | Admitting: Advanced Practice Midwife

## 2020-02-28 ENCOUNTER — Other Ambulatory Visit: Payer: Self-pay

## 2020-02-28 ENCOUNTER — Encounter (HOSPITAL_COMMUNITY): Payer: Self-pay | Admitting: Obstetrics & Gynecology

## 2020-02-28 ENCOUNTER — Inpatient Hospital Stay (HOSPITAL_COMMUNITY): Admission: AD | Admit: 2020-02-28 | Payer: Medicaid Other | Source: Home / Self Care | Admitting: Family Medicine

## 2020-02-28 DIAGNOSIS — Z23 Encounter for immunization: Secondary | ICD-10-CM

## 2020-02-28 DIAGNOSIS — D563 Thalassemia minor: Secondary | ICD-10-CM | POA: Diagnosis present

## 2020-02-28 DIAGNOSIS — Z2839 Other underimmunization status: Secondary | ICD-10-CM

## 2020-02-28 DIAGNOSIS — Z3A40 40 weeks gestation of pregnancy: Secondary | ICD-10-CM

## 2020-02-28 DIAGNOSIS — Z30017 Encounter for initial prescription of implantable subdermal contraceptive: Secondary | ICD-10-CM | POA: Diagnosis not present

## 2020-02-28 DIAGNOSIS — Z3480 Encounter for supervision of other normal pregnancy, unspecified trimester: Secondary | ICD-10-CM

## 2020-02-28 DIAGNOSIS — Z20822 Contact with and (suspected) exposure to covid-19: Secondary | ICD-10-CM | POA: Diagnosis present

## 2020-02-28 DIAGNOSIS — Z283 Underimmunization status: Secondary | ICD-10-CM

## 2020-02-28 DIAGNOSIS — Z3490 Encounter for supervision of normal pregnancy, unspecified, unspecified trimester: Secondary | ICD-10-CM

## 2020-02-28 DIAGNOSIS — O09899 Supervision of other high risk pregnancies, unspecified trimester: Secondary | ICD-10-CM

## 2020-02-28 DIAGNOSIS — O368390 Maternal care for abnormalities of the fetal heart rate or rhythm, unspecified trimester, not applicable or unspecified: Secondary | ICD-10-CM

## 2020-02-28 DIAGNOSIS — S2239XA Fracture of one rib, unspecified side, initial encounter for closed fracture: Secondary | ICD-10-CM | POA: Diagnosis present

## 2020-02-28 DIAGNOSIS — O26893 Other specified pregnancy related conditions, third trimester: Secondary | ICD-10-CM | POA: Diagnosis present

## 2020-02-28 DIAGNOSIS — Z87891 Personal history of nicotine dependence: Secondary | ICD-10-CM | POA: Diagnosis not present

## 2020-02-28 LAB — CBC
HCT: 32.1 % — ABNORMAL LOW (ref 36.0–46.0)
Hemoglobin: 10.2 g/dL — ABNORMAL LOW (ref 12.0–15.0)
MCH: 28.3 pg (ref 26.0–34.0)
MCHC: 31.8 g/dL (ref 30.0–36.0)
MCV: 89.2 fL (ref 80.0–100.0)
Platelets: 211 10*3/uL (ref 150–400)
RBC: 3.6 MIL/uL — ABNORMAL LOW (ref 3.87–5.11)
RDW: 15.1 % (ref 11.5–15.5)
WBC: 8.5 10*3/uL (ref 4.0–10.5)
nRBC: 0 % (ref 0.0–0.2)

## 2020-02-28 LAB — RPR: RPR Ser Ql: NONREACTIVE

## 2020-02-28 LAB — POCT FERN TEST: POCT Fern Test: NEGATIVE

## 2020-02-28 LAB — AMNISURE RUPTURE OF MEMBRANE (ROM) NOT AT ARMC: Amnisure ROM: POSITIVE

## 2020-02-28 LAB — TYPE AND SCREEN
ABO/RH(D): A POS
Antibody Screen: NEGATIVE

## 2020-02-28 SURGERY — Surgical Case
Anesthesia: Epidural

## 2020-02-28 MED ORDER — SOD CITRATE-CITRIC ACID 500-334 MG/5ML PO SOLN
30.0000 mL | ORAL | Status: DC | PRN
  Administered 2020-02-28: 30 mL via ORAL
  Filled 2020-02-28: qty 30

## 2020-02-28 MED ORDER — OXYCODONE-ACETAMINOPHEN 5-325 MG PO TABS: 1.0000 | ORAL_TABLET | ORAL | Status: DC | PRN

## 2020-02-28 MED ORDER — DIPHENHYDRAMINE HCL 25 MG PO CAPS: 25.0000 mg | ORAL_CAPSULE | Freq: Four times a day (QID) | ORAL | Status: DC | PRN

## 2020-02-28 MED ORDER — SCOPOLAMINE 1 MG/3DAYS TD PT72
1.0000 | MEDICATED_PATCH | Freq: Once | TRANSDERMAL | Status: AC
  Administered 2020-02-28: 1.5 mg via TRANSDERMAL

## 2020-02-28 MED ORDER — AMISULPRIDE (ANTIEMETIC) 5 MG/2ML IV SOLN: 10.0000 mg | Freq: Once | INTRAVENOUS | Status: DC | PRN

## 2020-02-28 MED ORDER — ONDANSETRON HCL 4 MG/2ML IJ SOLN: 4.0000 mg | Freq: Once | INTRAMUSCULAR | Status: DC | PRN

## 2020-02-28 MED ORDER — DIPHENHYDRAMINE HCL 50 MG/ML IJ SOLN: 12.5000 mg | INTRAMUSCULAR | Status: DC | PRN

## 2020-02-28 MED ORDER — KETOROLAC TROMETHAMINE 30 MG/ML IJ SOLN
30.0000 mg | Freq: Four times a day (QID) | INTRAMUSCULAR | Status: AC | PRN
  Administered 2020-02-28: 30 mg via INTRAVENOUS

## 2020-02-28 MED ORDER — FENTANYL CITRATE (PF) 100 MCG/2ML IJ SOLN
INTRAMUSCULAR | Status: AC
  Filled 2020-02-28: qty 2

## 2020-02-28 MED ORDER — LIDOCAINE HCL (PF) 2 % IJ SOLN
INTRAMUSCULAR | Status: DC | PRN
Start: 2020-02-28 — End: 2020-02-28
  Administered 2020-02-28 (×2): 100 mg via EPIDURAL
  Administered 2020-02-28: 60 mg via EPIDURAL

## 2020-02-28 MED ORDER — NALBUPHINE HCL 10 MG/ML IJ SOLN: 5.0000 mg | Freq: Once | INTRAMUSCULAR | Status: DC | PRN

## 2020-02-28 MED ORDER — NALOXONE HCL 4 MG/10ML IJ SOLN
1.0000 ug/kg/h | INTRAVENOUS | Status: DC | PRN
  Filled 2020-02-28: qty 5

## 2020-02-28 MED ORDER — LACTATED RINGERS IV SOLN: INTRAVENOUS | Status: DC

## 2020-02-28 MED ORDER — DEXAMETHASONE SODIUM PHOSPHATE 10 MG/ML IJ SOLN
INTRAMUSCULAR | Status: DC | PRN
  Administered 2020-02-28: 10 mg via INTRAVENOUS

## 2020-02-28 MED ORDER — MEASLES, MUMPS & RUBELLA VAC IJ SOLR
0.5000 mL | Freq: Once | INTRAMUSCULAR | Status: AC
  Administered 2020-03-02: 0.5 mL via SUBCUTANEOUS
  Filled 2020-02-28: qty 0.5

## 2020-02-28 MED ORDER — NALOXONE HCL 0.4 MG/ML IJ SOLN: 0.4000 mg | INTRAMUSCULAR | Status: DC | PRN

## 2020-02-28 MED ORDER — LACTATED RINGERS IV SOLN: INTRAVENOUS | Status: DC | PRN

## 2020-02-28 MED ORDER — SODIUM CHLORIDE 0.9 % IV SOLN
INTRAVENOUS | Status: AC
  Filled 2020-02-28: qty 500

## 2020-02-28 MED ORDER — OXYTOCIN-SODIUM CHLORIDE 30-0.9 UT/500ML-% IV SOLN: 2.5000 [IU]/h | INTRAVENOUS | Status: DC

## 2020-02-28 MED ORDER — KETOROLAC TROMETHAMINE 30 MG/ML IJ SOLN
INTRAMUSCULAR | Status: AC
  Filled 2020-02-28: qty 1

## 2020-02-28 MED ORDER — SODIUM CHLORIDE (PF) 0.9 % IJ SOLN
INTRAMUSCULAR | Status: DC | PRN
  Administered 2020-02-28: 12 mL/h via EPIDURAL

## 2020-02-28 MED ORDER — ACETAMINOPHEN 325 MG PO TABS: 650.0000 mg | ORAL_TABLET | ORAL | Status: DC | PRN

## 2020-02-28 MED ORDER — OXYCODONE HCL 5 MG PO TABS: 5.0000 mg | ORAL_TABLET | Freq: Once | ORAL | Status: DC | PRN

## 2020-02-28 MED ORDER — LIDOCAINE HCL (PF) 1 % IJ SOLN
INTRAMUSCULAR | Status: DC | PRN
  Administered 2020-02-28: 10 mL via EPIDURAL

## 2020-02-28 MED ORDER — SODIUM CHLORIDE 0.9 % IV SOLN
500.0000 mg | Freq: Once | INTRAVENOUS | Status: AC
  Administered 2020-02-28: 500 mg via INTRAVENOUS

## 2020-02-28 MED ORDER — MORPHINE SULFATE (PF) 0.5 MG/ML IJ SOLN
INTRAMUSCULAR | Status: AC
  Filled 2020-02-28: qty 10

## 2020-02-28 MED ORDER — ALBUMIN HUMAN 5 % IV SOLN: INTRAVENOUS | Status: DC | PRN

## 2020-02-28 MED ORDER — MAGNESIUM HYDROXIDE 400 MG/5ML PO SUSP
30.0000 mL | ORAL | Status: DC | PRN
  Administered 2020-03-01: 30 mL via ORAL
  Filled 2020-02-28: qty 30

## 2020-02-28 MED ORDER — FENTANYL-BUPIVACAINE-NACL 0.5-0.125-0.9 MG/250ML-% EP SOLN
12.0000 mL/h | EPIDURAL | Status: DC | PRN
  Filled 2020-02-28: qty 250

## 2020-02-28 MED ORDER — OXYTOCIN BOLUS FROM INFUSION: 333.0000 mL | Freq: Once | INTRAVENOUS | Status: DC

## 2020-02-28 MED ORDER — PHENYLEPHRINE 40 MCG/ML (10ML) SYRINGE FOR IV PUSH (FOR BLOOD PRESSURE SUPPORT)
80.0000 ug | PREFILLED_SYRINGE | INTRAVENOUS | Status: DC | PRN
  Filled 2020-02-28: qty 10

## 2020-02-28 MED ORDER — LIDOCAINE HCL (PF) 1 % IJ SOLN: 30.0000 mL | INTRAMUSCULAR | Status: DC | PRN

## 2020-02-28 MED ORDER — SODIUM CHLORIDE 0.9 % IV SOLN
INTRAVENOUS | Status: DC | PRN
Start: 2020-02-28 — End: 2020-02-28

## 2020-02-28 MED ORDER — OXYCODONE-ACETAMINOPHEN 5-325 MG PO TABS: 2.0000 | ORAL_TABLET | ORAL | Status: DC | PRN

## 2020-02-28 MED ORDER — SIMETHICONE 80 MG PO CHEW
80.0000 mg | CHEWABLE_TABLET | Freq: Three times a day (TID) | ORAL | Status: DC
  Administered 2020-02-29 – 2020-03-02 (×6): 80 mg via ORAL
  Filled 2020-02-28 (×6): qty 1

## 2020-02-28 MED ORDER — IBUPROFEN 800 MG PO TABS
800.0000 mg | ORAL_TABLET | Freq: Four times a day (QID) | ORAL | Status: DC
  Administered 2020-02-29 – 2020-03-02 (×9): 800 mg via ORAL
  Filled 2020-02-28 (×9): qty 1

## 2020-02-28 MED ORDER — PHENYLEPHRINE 40 MCG/ML (10ML) SYRINGE FOR IV PUSH (FOR BLOOD PRESSURE SUPPORT)
PREFILLED_SYRINGE | INTRAVENOUS | Status: DC | PRN
  Administered 2020-02-28 (×7): 80 ug via INTRAVENOUS

## 2020-02-28 MED ORDER — MORPHINE SULFATE (PF) 0.5 MG/ML IJ SOLN
INTRAMUSCULAR | Status: DC | PRN
  Administered 2020-02-28: 3 mg via EPIDURAL
  Administered 2020-02-28: 2 mg via EPIDURAL

## 2020-02-28 MED ORDER — OXYTOCIN-SODIUM CHLORIDE 30-0.9 UT/500ML-% IV SOLN: 1.0000 m[IU]/min | INTRAVENOUS | Status: DC

## 2020-02-28 MED ORDER — ACETAMINOPHEN 500 MG PO TABS: 1000.0000 mg | ORAL_TABLET | Freq: Four times a day (QID) | ORAL | Status: DC

## 2020-02-28 MED ORDER — NALBUPHINE HCL 10 MG/ML IJ SOLN: 5.0000 mg | INTRAMUSCULAR | Status: DC | PRN

## 2020-02-28 MED ORDER — PHENYLEPHRINE HCL-NACL 20-0.9 MG/250ML-% IV SOLN: INTRAVENOUS | Status: DC | PRN

## 2020-02-28 MED ORDER — SODIUM CHLORIDE 0.9% FLUSH: 3.0000 mL | INTRAVENOUS | Status: DC | PRN

## 2020-02-28 MED ORDER — PRENATAL MULTIVITAMIN CH
1.0000 | ORAL_TABLET | Freq: Every day | ORAL | Status: DC
  Administered 2020-02-29 – 2020-03-02 (×3): 1 via ORAL
  Filled 2020-02-28 (×3): qty 1

## 2020-02-28 MED ORDER — SENNOSIDES-DOCUSATE SODIUM 8.6-50 MG PO TABS
2.0000 | ORAL_TABLET | ORAL | Status: DC
  Administered 2020-02-28 – 2020-03-01 (×3): 2 via ORAL
  Filled 2020-02-28 (×3): qty 2

## 2020-02-28 MED ORDER — TERBUTALINE SULFATE 1 MG/ML IJ SOLN
0.2500 mg | Freq: Once | INTRAMUSCULAR | Status: AC | PRN
  Administered 2020-02-28: 0.25 mg via SUBCUTANEOUS
  Filled 2020-02-28: qty 1

## 2020-02-28 MED ORDER — FLEET ENEMA 7-19 GM/118ML RE ENEM: 1.0000 | ENEMA | RECTAL | Status: DC | PRN

## 2020-02-28 MED ORDER — LACTATED RINGERS IV SOLN
500.0000 mL | Freq: Once | INTRAVENOUS | Status: AC
  Administered 2020-02-28: 500 mL via INTRAVENOUS

## 2020-02-28 MED ORDER — OXYCODONE HCL 5 MG PO TABS
5.0000 mg | ORAL_TABLET | ORAL | Status: DC | PRN
  Administered 2020-02-29 – 2020-03-02 (×9): 10 mg via ORAL
  Filled 2020-02-28 (×9): qty 2

## 2020-02-28 MED ORDER — EPHEDRINE 5 MG/ML INJ: 10.0000 mg | INTRAVENOUS | Status: DC | PRN

## 2020-02-28 MED ORDER — SIMETHICONE 80 MG PO CHEW: 80.0000 mg | CHEWABLE_TABLET | ORAL | Status: DC | PRN

## 2020-02-28 MED ORDER — OXYTOCIN-SODIUM CHLORIDE 30-0.9 UT/500ML-% IV SOLN: 2.5000 [IU]/h | INTRAVENOUS | Status: AC

## 2020-02-28 MED ORDER — OXYTOCIN-SODIUM CHLORIDE 30-0.9 UT/500ML-% IV SOLN
INTRAVENOUS | Status: DC | PRN
  Administered 2020-02-28: 30 [IU] via INTRAVENOUS

## 2020-02-28 MED ORDER — PHENYLEPHRINE 40 MCG/ML (10ML) SYRINGE FOR IV PUSH (FOR BLOOD PRESSURE SUPPORT): 80.0000 ug | PREFILLED_SYRINGE | INTRAVENOUS | Status: DC | PRN

## 2020-02-28 MED ORDER — KETOROLAC TROMETHAMINE 30 MG/ML IJ SOLN
30.0000 mg | Freq: Four times a day (QID) | INTRAMUSCULAR | Status: AC
  Administered 2020-02-28 – 2020-02-29 (×3): 30 mg via INTRAVENOUS
  Filled 2020-02-28 (×3): qty 1

## 2020-02-28 MED ORDER — FENTANYL CITRATE (PF) 100 MCG/2ML IJ SOLN
INTRAMUSCULAR | Status: DC | PRN
  Administered 2020-02-28: 100 ug via EPIDURAL

## 2020-02-28 MED ORDER — DIBUCAINE (PERIANAL) 1 % EX OINT: 1.0000 "application " | TOPICAL_OINTMENT | CUTANEOUS | Status: DC | PRN

## 2020-02-28 MED ORDER — LACTATED RINGERS IV SOLN: 500.0000 mL | INTRAVENOUS | Status: DC | PRN

## 2020-02-28 MED ORDER — ENOXAPARIN SODIUM 40 MG/0.4ML ~~LOC~~ SOLN
40.0000 mg | SUBCUTANEOUS | Status: DC
  Administered 2020-02-29 – 2020-03-02 (×3): 40 mg via SUBCUTANEOUS
  Filled 2020-02-28 (×3): qty 0.4

## 2020-02-28 MED ORDER — OXYCODONE HCL 5 MG/5ML PO SOLN: 5.0000 mg | Freq: Once | ORAL | Status: DC | PRN

## 2020-02-28 MED ORDER — ONDANSETRON HCL 4 MG/2ML IJ SOLN
INTRAMUSCULAR | Status: AC
  Filled 2020-02-28: qty 2

## 2020-02-28 MED ORDER — FENTANYL CITRATE (PF) 100 MCG/2ML IJ SOLN
25.0000 ug | INTRAMUSCULAR | Status: DC | PRN
  Administered 2020-02-28 (×2): 50 ug via INTRAVENOUS

## 2020-02-28 MED ORDER — CEFAZOLIN SODIUM-DEXTROSE 2-4 GM/100ML-% IV SOLN
2.0000 g | Freq: Once | INTRAVENOUS | Status: AC
  Administered 2020-02-28: 2 g via INTRAVENOUS

## 2020-02-28 MED ORDER — LACTATED RINGERS AMNIOINFUSION: INTRAVENOUS | Status: DC

## 2020-02-28 MED ORDER — MEPERIDINE HCL 25 MG/ML IJ SOLN: 6.2500 mg | INTRAMUSCULAR | Status: DC | PRN

## 2020-02-28 MED ORDER — KETOROLAC TROMETHAMINE 30 MG/ML IJ SOLN: 30.0000 mg | Freq: Four times a day (QID) | INTRAMUSCULAR | Status: AC | PRN

## 2020-02-28 MED ORDER — MENTHOL 3 MG MT LOZG: 1.0000 | LOZENGE | OROMUCOSAL | Status: DC | PRN

## 2020-02-28 MED ORDER — ONDANSETRON HCL 4 MG/2ML IJ SOLN: 4.0000 mg | Freq: Four times a day (QID) | INTRAMUSCULAR | Status: DC | PRN

## 2020-02-28 MED ORDER — ACETAMINOPHEN 500 MG PO TABS
1000.0000 mg | ORAL_TABLET | Freq: Four times a day (QID) | ORAL | Status: DC
  Administered 2020-02-28 – 2020-03-02 (×11): 1000 mg via ORAL
  Filled 2020-02-28 (×12): qty 2

## 2020-02-28 MED ORDER — SODIUM BICARBONATE 8.4 % IV SOLN
INTRAVENOUS | Status: AC
  Filled 2020-02-28: qty 50

## 2020-02-28 MED ORDER — COCONUT OIL OIL
1.0000 "application " | TOPICAL_OIL | Status: DC | PRN
  Administered 2020-02-28: 1 via TOPICAL

## 2020-02-28 MED ORDER — ONDANSETRON HCL 4 MG/2ML IJ SOLN: 4.0000 mg | Freq: Three times a day (TID) | INTRAMUSCULAR | Status: DC | PRN

## 2020-02-28 MED ORDER — ONDANSETRON HCL 4 MG/2ML IJ SOLN
INTRAMUSCULAR | Status: DC | PRN
  Administered 2020-02-28: 4 mg via INTRAVENOUS

## 2020-02-28 MED ORDER — DIPHENHYDRAMINE HCL 25 MG PO CAPS: 25.0000 mg | ORAL_CAPSULE | ORAL | Status: DC | PRN

## 2020-02-28 MED ORDER — WITCH HAZEL-GLYCERIN EX PADS: 1.0000 "application " | MEDICATED_PAD | CUTANEOUS | Status: DC | PRN

## 2020-02-28 MED ORDER — KETOROLAC TROMETHAMINE 30 MG/ML IJ SOLN: 30.0000 mg | Freq: Once | INTRAMUSCULAR | Status: DC

## 2020-02-28 MED ORDER — SCOPOLAMINE 1 MG/3DAYS TD PT72
MEDICATED_PATCH | TRANSDERMAL | Status: AC
  Filled 2020-02-28: qty 1

## 2020-02-28 MED ORDER — HYDROMORPHONE HCL 1 MG/ML IJ SOLN
0.2000 mg | INTRAMUSCULAR | Status: DC | PRN
  Administered 2020-02-28: 0.2 mg via INTRAVENOUS
  Filled 2020-02-28: qty 1

## 2020-02-28 MED ORDER — SIMETHICONE 80 MG PO CHEW
80.0000 mg | CHEWABLE_TABLET | ORAL | Status: DC
  Administered 2020-02-28 – 2020-03-01 (×3): 80 mg via ORAL
  Filled 2020-02-28 (×3): qty 1

## 2020-02-28 SURGICAL SUPPLY — 33 items
BENZOIN TINCTURE PRP APPL 2/3 (GAUZE/BANDAGES/DRESSINGS) ×2 IMPLANT
CHLORAPREP W/TINT 26 (MISCELLANEOUS) ×2 IMPLANT
CLAMP CORD UMBIL (MISCELLANEOUS) ×2 IMPLANT
CLOTH BEACON ORANGE TIMEOUT ST (SAFETY) ×2 IMPLANT
DRSG OPSITE POSTOP 4X10 (GAUZE/BANDAGES/DRESSINGS) ×2 IMPLANT
ELECT REM PT RETURN 9FT ADLT (ELECTROSURGICAL) ×2
ELECTRODE REM PT RTRN 9FT ADLT (ELECTROSURGICAL) ×1 IMPLANT
EXTRACTOR VACUUM KIWI (MISCELLANEOUS) IMPLANT
EXTRACTOR VACUUM M CUP 4 TUBE (SUCTIONS) IMPLANT
GLOVE BIO SURGEON STRL SZ7.5 (GLOVE) ×2 IMPLANT
GLOVE BIOGEL PI IND STRL 7.0 (GLOVE) ×2 IMPLANT
GLOVE BIOGEL PI INDICATOR 7.0 (GLOVE) ×2
GOWN STRL REUS W/TWL 2XL LVL3 (GOWN DISPOSABLE) ×2 IMPLANT
GOWN STRL REUS W/TWL LRG LVL3 (GOWN DISPOSABLE) ×4 IMPLANT
HEMOSTAT ARISTA ABSORB 3G PWDR (HEMOSTASIS) IMPLANT
KIT ABG SYR 3ML LUER SLIP (SYRINGE) IMPLANT
NEEDLE HYPO 25X5/8 SAFETYGLIDE (NEEDLE) IMPLANT
NS IRRIG 1000ML POUR BTL (IV SOLUTION) ×2 IMPLANT
PACK C SECTION WH (CUSTOM PROCEDURE TRAY) ×2 IMPLANT
PAD OB MATERNITY 4.3X12.25 (PERSONAL CARE ITEMS) ×2 IMPLANT
RTRCTR C-SECT PINK 25CM LRG (MISCELLANEOUS) ×2 IMPLANT
STRIP CLOSURE SKIN 1/2X4 (GAUZE/BANDAGES/DRESSINGS) ×2 IMPLANT
SUT PDS AB 0 CTX 36 PDP370T (SUTURE) IMPLANT
SUT PLAIN 0 NONE (SUTURE) IMPLANT
SUT PLAIN 2 0 (SUTURE)
SUT PLAIN ABS 2-0 CT1 27XMFL (SUTURE) IMPLANT
SUT VIC AB 0 CT1 36 (SUTURE) ×12 IMPLANT
SUT VIC AB 2-0 CT1 27 (SUTURE) ×2
SUT VIC AB 2-0 CT1 TAPERPNT 27 (SUTURE) ×2 IMPLANT
SUT VIC AB 4-0 KS 27 (SUTURE) ×2 IMPLANT
TOWEL OR 17X24 6PK STRL BLUE (TOWEL DISPOSABLE) ×4 IMPLANT
TRAY FOLEY W/BAG SLVR 14FR LF (SET/KITS/TRAYS/PACK) IMPLANT
WATER STERILE IRR 1000ML POUR (IV SOLUTION) ×2 IMPLANT

## 2020-02-28 NOTE — MAU Note (Signed)
..  Kristen Perez is a 34 y.o. at [redacted]w[redacted]d here in MAU via EMS reporting: CTX every minute. Pt reports a gush of yellow-ish fluid at 0404 am. +FM. Denies vaginal bleeding but she reports loosing her mucous plug.   Pain score: 10/10  FHT: monitors applied. Assessing

## 2020-02-28 NOTE — Lactation Note (Addendum)
This note was copied from a baby's chart. Lactation Consultation Note  Patient Name: Kristen Perez JASNK'N Date: 02/28/2020 Reason for consult: Initial assessment;1st time breastfeeding;Term  P4, 6 hour female infant, was in [address] due low body temperatures. Mom's feeding choice was formula at admission and currently it is breast and formula feeding her baby. LC entered room per mom, infant in Nursery mom really wants to BF she did not BF her other children, excepted attempted with 3rd child who did not latch well at breast, she tried for 3 days.  Her 3rd child is 62 years old. 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  Mom shown how to use hand pump & how to disassemble, clean, & reassemble parts.  This is infant's 2nd time latching at breast, per mom, infant latched well in recovery and BF for 20 minutes. LC discussed hand expression and mom easily expressed 2 mls of colostrum in bullet that she will offer after latching infant at breast at next feeding. Per mom, infant was given formula at the 2nd feeding but doesn't appear to like it so she plans to mainly breastfeed infant, LC praised mom on her choice of exclusively BF for now.  Infant returned to room, mom latched infant on her right breast using the football hold, infant immediately open mouth and latched with nose and chin touching breast, swallows observed and infant was still BF after LC left the room. Mom knows to BF according to cues, 8 to 12 + within 24 hours and on demand. Mom knows to call RN or LC if she needs further assistance with latching infant at breast. LC discussed the benefits of STS and parents will try to do as much STS with infant as possible. Mom made aware of O/P services, breastfeeding support groups, community resources, and our phone # for post-discharge  questions.   Maternal Data Formula Feeding for Exclusion: Yes Reason for exclusion: Mother's choice to formula feed on admision Has patient been taught Hand Expression?: No Does the patient have breastfeeding experience prior to this delivery?: Yes  Feeding Feeding Type: Breast Fed Nipple Type: Slow - flow  LATCH Score Latch: Grasps breast easily, tongue down, lips flanged, rhythmical sucking.  Audible Swallowing: Spontaneous and intermittent  Type of Nipple: Everted at rest and after stimulation  Comfort (Breast/Nipple): Soft / non-tender  Hold (Positioning): Assistance needed to correctly position infant at breast and maintain latch.  LATCH Score: 9  Interventions Interventions: Breast feeding basics reviewed;Assisted with latch;Breast compression;Skin to skin;Adjust position;Breast massage;Support pillows;Hand express;Position options;Hand pump;Expressed milk  Lactation Tools Discussed/Used WIC Program: No Pump Review: Milk Storage;Setup, frequency, and cleaning Initiated by:: Danelle Earthly, IBCLC Date initiated:: 02/28/20   Consult Status Consult Status: Follow-up Date: 02/29/20 Follow-up type: In-patient    Danelle Earthly 02/28/2020, 5:22 PM

## 2020-02-28 NOTE — Plan of Care (Signed)
Jearl Soto, RN 
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
## 2020-02-28 NOTE — Progress Notes (Signed)
This note also relates to the following rows which could not be included: SpO2 - Cannot attach notes to unvalidated device data  Dr. Mayford Knife at bedside, discussing with pt options due to FHR decels. Discussing the risks and benefits of c-section vs continuing in labor, pt requests to move forward with c-section. Risks and benefits of c-section explained, FOB at bedside, pt verbalizes understanding and consents to c-section.
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
## 2020-02-28 NOTE — Transfer of Care (Signed)
Immediate Anesthesia Transfer of Care Note  Patient: Kristen Perez  Procedure(s) Performed: CESAREAN SECTION  Patient Location: PACU  Anesthesia Type:Epidural  Level of Consciousness: awake  Airway & Oxygen Therapy: Patient Spontanous Breathing  Post-op Assessment: Report given to RN  Post vital signs: Reviewed and stable  Last Vitals:  Vitals Value Taken Time  BP 113/80 02/28/20 1145  Temp    Pulse 101 02/28/20 1147  Resp 23 02/28/20 1147  SpO2 99 % 02/28/20 1147  Vitals shown include unvalidated device data.  Last Pain:  Vitals:   02/28/20 1020  TempSrc: Oral  PainSc:       Patients Stated Pain Goal: 1 (02/28/20 0705)  Complications: No complications documented.

## 2020-02-28 NOTE — Op Note (Addendum)
Operative Note   SURGERY DATE: 02/28/2020  PRE-OP DIAGNOSIS:  Non reassuring fetal heart tones  POST-OP DIAGNOSIS: same as above   PROCEDURE: primary low transverse cesarean section via pfannenstiel skin incision with double layer uterine closure  SURGEON: Surgeon(s) and Role:    * Malachy Chamber, MD - Primary    * Alric Seton, MD - Fellow  ASSISTANT: none  ANESTHESIA: epidural  ESTIMATED BLOOD LOSS: 871  DRAINS: UOP via indwelling foley  TOTAL IV FLUIDS: 2200 mL LR, 250 mL albumin  VTE PROPHYLAXIS: SCDs to bilateral lower extremities  ANTIBIOTICS: Two grams of Cefazolin were given and azithromycin 500cL, within 1 hour of skin incision  SPECIMENS: placenta to pathology  COMPLICATIONS: none  INDICATIONS: NRFHT  FINDINGS: No intra-abdominal adhesions were noted. Grossly normal uterus, tubes and ovaries. Meconium stained amniotic fluid, cephalic female infant, weight pending, APGARs per neonatology, intact placenta.  PROCEDURE IN DETAIL: The patient was taken to the operating room where anesthesia was administered and normal fetal heart tones were confirmed. She was then prepped and draped in the normal fashion in the dorsal supine position with a leftward tilt.  After a time out was performed, a pfannensteil skin incision was made with the scalpel and carried through to the underlying layer of fascia. The fascia was then incised at the midline and this incision was extended laterally with the mayo scissors. Attention was turned to the superior aspect of the fascial incision which was grasped with the kocher clamps x 2, tented up and the rectus muscles were dissected off bluntly. In a similar fashion the inferior aspect of the fascial incision was grasped with the kocher clamps, tented up and the rectus muscles dissected off bluntly. The rectus muscles were then separated in the midline and the peritoneum was entered bluntly. The alexis retractor was inserted.  A  low transverse hysterotomy was made with the scalpel until the endometrial cavity was breached and the amniotic sac ruptured spontaneously, yielding meconium stained amniotic fluid. This incision was extended bluntly and the infant's head, shoulders and body were delivered atraumatically.The cord was clamped x 2 and cut, and the infant was handed to the awaiting pediatricians immediately.  The placenta was then gradually expressed from the uterus and then the uterus was cleared of all clots and debris. The hysterotomy was repaired with a running suture of 0 Vicryl. A second imbricating layer of 0 Vicryl suture was then placed. Several compression sutures were added to achieve excellent hemostasis. The hysterotomy and all operative sites were reinspected and excellent hemostasis was noted.  The peritoneum was closed with a running stitch of 0 Vicryl. The fascia was reapproximated with 0 Vicryl in a simple running fashion bilaterally. The subcutaneous layer was then reapproximated with interrupted sutures of 2-0 vicryl, and the skin was then closed with 4-0 monocryl, in a subcuticular fashion.  The patient  tolerated the procedure well. Sponge, lap, needle, and instrument counts were correct x 2. The patient was transferred to the recovery room awake, alert and breathing independently in stable condition.  Alric Seton, MD OB Fellow, [facility], Center for [facility] 02/28/2020 11:54 AM

## 2020-02-28 NOTE — Anesthesia Procedure Notes (Signed)
Epidural Patient location during procedure: OB Start time: 02/28/2020 7:33 AM End time: 02/28/2020 7:42 AM  Staffing Anesthesiologist: Lucretia Kern, MD Performed: anesthesiologist   Preanesthetic Checklist Completed: patient identified, IV checked, risks and benefits discussed, monitors and equipment checked, pre-op evaluation and timeout performed  Epidural Patient position: sitting Prep: DuraPrep Patient monitoring: heart rate, continuous pulse ox and blood pressure Approach: midline Location: L3-L4 Injection technique: LOR air  Needle:  Needle type: Tuohy  Needle gauge: 17 G Needle length: 9 cm Needle insertion depth: 5 cm Catheter type: closed end flexible Catheter size: 19 Gauge Catheter at skin depth: 10 cm Test dose: negative  Assessment Events: blood not aspirated, injection not painful, no injection resistance, no paresthesia and negative IV test  Additional Notes Reason for block:procedure for pain

## 2020-02-28 NOTE — Anesthesia Postprocedure Evaluation (Signed)
Anesthesia Post Note  Patient: Kristen Perez  Procedure(s) Performed: CESAREAN SECTION     Patient location during evaluation: PACU Anesthesia Type: Epidural Level of consciousness: oriented and awake and alert Pain management: pain level controlled Vital Signs Assessment: post-procedure vital signs reviewed and stable Respiratory status: spontaneous breathing, respiratory function stable and nonlabored ventilation Cardiovascular status: blood pressure returned to baseline and stable Postop Assessment: no headache, no backache, no apparent nausea or vomiting and epidural receding Anesthetic complications: no   No complications documented.  Last Vitals:  Vitals:   02/28/20 1230 02/28/20 1245  BP: 114/62 (!) 116/56  Pulse: 88 94  Resp: (!) 21 13  Temp:  36.8 C  SpO2: 98% 94%    Last Pain:  Vitals:   02/28/20 1245  TempSrc:   PainSc: 4    Pain Goal: Patients Stated Pain Goal: 1 (02/28/20 0705)  LLE Motor Response: Purposeful movement (02/28/20 1245)   RLE Motor Response: Purposeful movement (02/28/20 1245)       Epidural/Spinal Function Cutaneous sensation: Able to Discern Pressure (02/28/20 1230), Patient able to flex knees: Yes (02/28/20 1245), Patient able to lift hips off bed: Yes (02/28/20 1245), Back pain beyond tenderness at insertion site: No (02/28/20 1245), Progressively worsening motor and/or sensory loss: No (02/28/20 1245), Bowel and/or bladder incontinence post epidural: No (02/28/20 1245)  Lucretia Kern

## 2020-02-28 NOTE — Anesthesia Preprocedure Evaluation (Addendum)
Anesthesia Evaluation   Patient awake    Reviewed: Allergy & Precautions, H&P , NPO status , Patient's Chart, lab work & pertinent test results  History of Anesthesia Complications Negative for: history of anesthetic complications  Airway Mallampati: II  TM Distance: >3 FB Neck ROM: full    Dental no notable dental hx.    Pulmonary neg pulmonary ROS, former smoker,    Pulmonary exam normal        Cardiovascular negative cardio ROS Normal cardiovascular exam Rhythm:regular Rate:Normal     Neuro/Psych negative neurological ROS  negative psych ROS   GI/Hepatic negative GI ROS, Neg liver ROS,   Endo/Other  negative endocrine ROS  Renal/GU negative Renal ROS  negative genitourinary   Musculoskeletal   Abdominal   Peds  Hematology  (+) Blood dyscrasia, anemia ,   Anesthesia Other Findings   Reproductive/Obstetrics (+) Pregnancy                            Anesthesia Physical Anesthesia Plan  ASA: II and emergent  Anesthesia Plan: Epidural   Post-op Pain Management:    Induction:   PONV Risk Score and Plan:   Airway Management Planned:   Additional Equipment:   Intra-op Plan:   Post-operative Plan:   Informed Consent: I have reviewed the patients History and Physical, chart, labs and discussed the procedure including the risks, benefits and alternatives for the proposed anesthesia with the patient or authorized representative who has indicated his/her understanding and acceptance.       Plan Discussed with:   Anesthesia Plan Comments: (To OR for C section due to nonreassuring FHT)       Anesthesia Quick Evaluation

## 2020-02-28 NOTE — Progress Notes (Signed)
Called to room by RN for second prolonged decel into the 60s. FSE, IUPC and amnioinfusion already in place. Dr. Mayford Knife to bedside. Scalp stim present with application of second FSE.  Due to 2 rpolonged decels >5 minutes in a row, and still 4.5 cm dilated, as well as terbutaline given, primary LTCS recommended.  The risks of cesarean section were discussed with the patient including but were not limited to: bleeding which may require transfusion or reoperation; infection which may require antibiotics; injury to bowel, bladder, ureters or other surrounding organs; injury to the fetus; need for additional procedures including hysterectomy in the event of a life-threatening hemorrhage; placental abnormalities wth subsequent pregnancies, incisional problems, thromboembolic phenomenon and other postoperative/anesthesia complications.  The patient concurred with the proposed plan, giving informed written consent for the procedures.  Patient has been NPO since midnight she will remain NPO for procedure. Anesthesia and OR aware.  Preoperative prophylactic antibiotics and SCDs ordered on call to the OR.  To OR when ready.  Marlowe Alt, DO OB Fellow, [facility] 02/28/2020 10:18 AM

## 2020-02-29 LAB — CBC
HCT: 23.7 % — ABNORMAL LOW (ref 36.0–46.0)
Hemoglobin: 7.7 g/dL — ABNORMAL LOW (ref 12.0–15.0)
MCH: 28.9 pg (ref 26.0–34.0)
MCHC: 32.5 g/dL (ref 30.0–36.0)
MCV: 89.1 fL (ref 80.0–100.0)
Platelets: 176 K/uL (ref 150–400)
RBC: 2.66 MIL/uL — ABNORMAL LOW (ref 3.87–5.11)
RDW: 15.1 % (ref 11.5–15.5)
WBC: 10.8 K/uL — ABNORMAL HIGH (ref 4.0–10.5)
nRBC: 0 % (ref 0.0–0.2)

## 2020-02-29 MED ORDER — ETONOGESTREL 68 MG ~~LOC~~ IMPL
68.0000 mg | DRUG_IMPLANT | Freq: Once | SUBCUTANEOUS | Status: AC
  Administered 2020-03-01: 68 mg via SUBCUTANEOUS
  Filled 2020-02-29: qty 1

## 2020-02-29 MED ORDER — SODIUM CHLORIDE 0.9 % IV SOLN: 510.0000 mg | Freq: Once | INTRAVENOUS | Status: DC

## 2020-02-29 MED ORDER — LIDOCAINE HCL 1 % IJ SOLN
0.0000 mL | Freq: Once | INTRAMUSCULAR | Status: AC | PRN
  Administered 2020-03-01: 20 mL via INTRADERMAL
  Filled 2020-02-29: qty 20

## 2020-02-29 MED ORDER — SODIUM CHLORIDE 0.9 % IV SOLN
500.0000 mg | Freq: Once | INTRAVENOUS | Status: AC
  Administered 2020-02-29: 500 mg via INTRAVENOUS
  Filled 2020-02-29: qty 25

## 2020-02-29 NOTE — Progress Notes (Addendum)
POSTPARTUM PROGRESS NOTE  Subjective: Kristen Perez is a 34 y.o. X3G1829 s/p LTCS at [redacted]w[redacted]d.  She reports she doing well. No acute events overnight. She reports some mild dizziness upon first ambulation in the morning, denies dyspnea. She denies any problems with ambulating, voiding or po intake. Denies nausea or vomiting. She has not passed flatus. Pain is moderately controlled.  Lochia is appropriate.  Objective: Blood pressure 105/60, pulse 71, temperature 98.2 F (36.8 C), temperature source Oral, resp. rate 18, last menstrual period 05/24/2019, SpO2 100 %, unknown if currently breastfeeding.  Physical Exam:  General: alert, cooperative and no distress Chest: no respiratory distress Abdomen: soft, non-tender  Uterine Fundus: firm, appropriately tender Extremities: No calf swelling or tenderness  minimal non-pitting pedal edema  Recent Labs    02/28/20 0641 02/29/20 0608  HGB 10.2* 7.7*  HCT 32.1* 23.7*    Assessment/Plan: Keili Hasten is a 34 y.o. H3Z1696 s/p LTCS at [redacted]w[redacted]d for NRFHT.  Routine Postpartum Care: Doing well, pain well-controlled.  -- Continue routine care, lactation support  -- Contraception: Unsure, discuss before discharge. -- Feeding: Breast  #Anemia: S/p C-section, Hgb 12.9>10>7.7. Mildly symptomatic with minimal dizziness upon morning ambulation. Venofer to be started, discuss with patient.  Dispo: Plan for discharge tomorrow.  Evelena Leyden, DO PGY1 Family Medicine Resident  Attestation of Supervision of Student:  I confirm that I have verified the information documented in the medical student's note and that I have also personally reperformed the history, physical exam and all medical decision making activities.  I have verified that all services and findings are accurately documented in this student's note; and I agree with management and plan as outlined in the documentation. I have also made any necessary editorial changes.  Pt amenable to  Venofer for iron deficiency anemia, ordered and RN to start. Discussed birth control, pt would like an inpatient Nexplanon placed prior to discharge. Will order and place this afternoon.  Bernerd Limbo, CNM Center for [employer], [facility] 02/29/2020 10:55 AM
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
## 2020-03-01 ENCOUNTER — Encounter: Payer: Medicaid Other | Admitting: Medical

## 2020-03-01 DIAGNOSIS — Z30017 Encounter for initial prescription of implantable subdermal contraceptive: Secondary | ICD-10-CM

## 2020-03-01 LAB — SURGICAL PATHOLOGY

## 2020-03-01 MED ORDER — IBUPROFEN 800 MG PO TABS: 800.0000 mg | ORAL_TABLET | Freq: Four times a day (QID) | ORAL | 0 refills | Status: AC | PRN

## 2020-03-01 MED ORDER — OXYCODONE HCL 5 MG PO TABS: 5.0000 mg | ORAL_TABLET | Freq: Four times a day (QID) | ORAL | 0 refills | Status: DC | PRN

## 2020-03-01 NOTE — Progress Notes (Signed)
CSW followed back up with [facility] CPS worker Rondel Baton. CSW left message. CSW noted that response time on case is 72 hours. CSW awaiting call back at this time from CPS, however no barriers.   CSW provided MOB with carseat for infant as well.   Claude Manges Innocence Schlotzhauer, MSW, LCSW [facility] [place] [phone]

## 2020-03-01 NOTE — Progress Notes (Signed)
Patient called to look at her arm and in pain. Left arm is significantly swollen from the bandage wrap from the Nexplanon placed this morning. Talked with Faculty Practice to remove the bandage and notify of the swelling. Patient also wants discharged cancelled. Faculty in agreement.

## 2020-03-01 NOTE — Procedures (Addendum)
POSTPARTUM PROCEDURE NOTE  Ms. Kristen Perez is a 34 y.o. [id number] for Nexplanon insertion. pLTCS on 02/28/20. No concerns at this time.  Last pap smear was in 2019 and was normal per patient.  Nexplanon Insertion Procedure Patient was given informed consent, she signed consent form.  Patient does understand that irregular bleeding is a very common side effect of this medication. She was advised to abstain from intercourse during the postpartum period.   Appropriate time out taken.  Patient's left arm was prepped and draped in the usual sterile fashion.. The ruler used to measure and mark insertion area.  Patient was prepped with alcohol swab and then injected with 3 ml of 1% lidocaine.  She was prepped with betadine, Nexplanon removed from packaging,  Device confirmed in needle, then inserted full length of needle and withdrawn per handbook instructions. Nexplanon was able to palpated in the patient's arm; patient palpated the insert herself. There was minimal blood loss.  Patient insertion site covered with guaze and a pressure bandage to reduce any bruising.  The patient tolerated the procedure well and was given post procedure instructions.   LOT# [id number] EXP: [id number]  Sharyon Cable, CNM 03/01/2020 8:13 AM

## 2020-03-01 NOTE — Discharge Instructions (Signed)

## 2020-03-02 MED ORDER — OXYCODONE HCL 5 MG PO TABS: 5.0000 mg | ORAL_TABLET | Freq: Four times a day (QID) | ORAL | 0 refills | Status: AC | PRN

## 2020-03-02 MED FILL — IBUPROFEN 800 MG TAB: 800 | 7 days supply | Qty: 30 | Fill #0

## 2020-03-02 MED FILL — oxyCODONE HCL 5 MG TABS: 5 | 5 days supply | Qty: 20 | Fill #0

## 2020-03-02 NOTE — Plan of Care (Signed)
  Problem: Life Cycle: Goal: Chance of risk for complications during the postpartum period will decrease Outcome: Adequate for Discharge   Problem: Role Relationship: Goal: Ability to demonstrate positive interaction with newborn will improve Outcome: Adequate for Discharge   Problem: Skin Integrity: Goal: Demonstration of wound healing without infection will improve Outcome: Adequate for Discharge   Problem: Education: Goal: Knowledge of condition will improve Outcome: Adequate for Discharge   Problem: Skin Integrity: Goal: Demonstration of wound healing without infection will improve Outcome: Adequate for Discharge

## 2020-03-02 NOTE — Discharge Summary (Addendum)
   Postpartum Discharge Summary   Patient Name: Kristen Perez DOB: 02/17/1986 MRN: [id number]  Date of admission: 02/28/2020 Delivery date:02/28/2020  Delivering provider: WILLIAMS, LANDON T  Date of discharge: 03/02/2020  Admitting diagnosis: Term pregnancy [Z34.90] Intrauterine pregnancy: [redacted]w[redacted]d     Secondary diagnosis:  Active Problems:   Supervision of other normal pregnancy, antepartum   Rib fracture   Rubella non-immune status, antepartum   Term pregnancy   Cesarean delivery delivered   Fetal heart rate non-reassuring affecting management of mother  Additional problems: none    Discharge diagnosis: Term Pregnancy Delivered                                              Post partum procedures: nexplanon insertion Augmentation:  none Complications: None  Hospital course: Onset of Labor With Unplanned C/S   34 y.o. yo G5P4014 at [redacted]w[redacted]d was admitted in Latent Labor on 02/28/2020. Patient had a labor course significant for NRFHT and decision was made to proceed with cesarean section. The patient went for cesarean section due to Non-Reassuring FHR. Delivery details as follows: Membrane Rupture Time/Date: 4:04 AM ,02/28/2020   Delivery Method:C-Section, Low Vertical  Details of operation can be found in separate operative note. Patient had an uncomplicated postpartum course.  She is ambulating,tolerating a regular diet, passing flatus, and urinating well.  Patient is discharged home in stable condition 03/02/20.  Newborn Data: Birth date:02/28/2020  Birth time:10:48 AM  Gender:Female  Living status:Living  Apgars:7 ,8  Weight:3275 g   Magnesium Sulfate received: No BMZ received: No Rhophylac:N/A MMR:Yes T-DaP:Given prenatally Flu: N/A Transfusion:No  Physical exam  Vitals:   03/01/20 0458 03/01/20 1300 03/01/20 2335 03/02/20 0545  BP: 118/64 119/66 108/71 121/68  Pulse: 76 98 91 90  Resp: 20 18 18 18  Temp: 98 F (36.7 C) 99 F (37.2 C) 98.1 F (36.7 C) 98.1 F (36.7 C)   TempSrc: Oral Oral Oral Oral  SpO2: 100% 100% 100% 99%   General: alert, cooperative and no distress Lochia: appropriate Uterine Fundus: firm Incision: Dressing is clean, dry, and intact DVT Evaluation: No evidence of DVT seen on physical exam. No cords or calf tenderness. No significant calf/ankle edema. Labs: Lab Results  Component Value Date   WBC 10.8 (H) 02/29/2020   HGB 7.7 (L) 02/29/2020   HCT 23.7 (L) 02/29/2020   MCV 89.1 02/29/2020   PLT 176 02/29/2020   No flowsheet data found. Edinburgh Score: Edinburgh Postnatal Depression Scale Screening Tool 03/01/2020  I have been able to laugh and see the funny side of things. 0  I have looked forward with enjoyment to things. 0  I have blamed myself unnecessarily when things went wrong. 2  I have been anxious or worried for no good reason. 1  I have felt scared or panicky for no good reason. 1  Things have been getting on top of me. 1  I have been so unhappy that I have had difficulty sleeping. 1  I have felt sad or miserable. 1  I have been so unhappy that I have been crying. 1  The thought of harming myself has occurred to me. 0  Edinburgh Postnatal Depression Scale Total 8     After visit meds:  Allergies as of 03/02/2020   No Known Allergies      Medication List     TAKE   these medications    Blood Pressure Kit Devi 1 Device by Does not apply route daily. ICD 10: Z34.00   ibuprofen 800 MG tablet Commonly known as: ADVIL Take 1 tablet (800 mg total) by mouth every 6 (six) hours as needed for moderate pain.   oxyCODONE 5 MG immediate release tablet Commonly known as: Oxy IR/ROXICODONE Take 1 tablet (5 mg total) by mouth every 6 (six) hours as needed for moderate pain.   Prenatal Vitamin 27-0.8 MG Tabs Take 1 tablet by mouth daily.               Discharge Care Instructions  (From admission, onward)           Start     Ordered   03/01/20 0000  If the dressing is still on your incision site  when you go home, remove it on the third day after your surgery date. Remove dressing if it begins to fall off, or if it is dirty or damaged before the third day.        03/01/20 0829             Discharge home in stable condition Infant Feeding: Bottle and Breast Infant Disposition:home with mother Discharge instruction: per After Visit Summary and Postpartum booklet. Activity: Advance as tolerated. Pelvic rest for 6 weeks.  Diet: routine diet Future Appointments: Future Appointments  Date Time Provider Department Center  03/07/2020  1:30 PM [facility] NURSE [facility]  03/31/2020 10:15 AM Wenzel, Julie N, PA-C [facility]   Follow up Visit:  Follow-up Information     [facility]. Schedule an appointment as soon as possible for a visit in 4 week(s).   Specialty: Obstetrics and Gynecology Why: Make appointment to be seen in 1 week for incision check and 4 weeks for postpartum appointment  Contact information: [address] [place] [address] [phone]               Please schedule this patient for a In person postpartum visit in 4 weeks with the following provider: Any provider. Additional Postpartum F/U:Incision check 1 week  Low risk pregnancy complicated by:  n/a Delivery mode:  C-Section, Low Vertical  Anticipated Birth Control:  Nexplanon placed prior to discharge home    03/02/2020 Peter Frank, MD  GME ATTESTATION:  I saw and evaluated the patient. I agree with the findings and the plan of care as documented in the resident's note.  Hailey L Sparacino, DO OB Fellow, [facility] [place], [facility] 03/02/2020 9:10 AM  

## 2020-03-07 ENCOUNTER — Ambulatory Visit: Payer: Medicaid Other | Admitting: *Deleted

## 2020-03-07 ENCOUNTER — Other Ambulatory Visit: Payer: Self-pay

## 2020-03-07 VITALS — BP 136/88 | HR 84 | Ht 63.0 in | Wt 181.1 lb

## 2020-03-07 DIAGNOSIS — Z5189 Encounter for other specified aftercare: Secondary | ICD-10-CM

## 2020-03-07 NOTE — Progress Notes (Signed)
Here for incision and bp check . Had c/s on 02/28/20. States having sharp pain =10 that starts lower back and shoots up to back of head- states started about 3 days ago.  States incision pain=10 has gotten worse. States the oxycodone and ibuprofen not helping at all.  BP elevated at 13l6/94 . Denies headache. C/o edema at times- worse at night. At present none noted. Repeat bp 136/88. Incision CDI with a few steristrips. I removed remaining steristrips.  No redness, or drainage noted. Discussed with Dr. Barb Merino.  She came in to see patient. Advised to go to [facility] for evaluation of possible endometritis  Zyon Grout,RN

## 2020-03-30 ENCOUNTER — Encounter: Payer: Self-pay | Admitting: General Practice

## 2020-03-31 ENCOUNTER — Ambulatory Visit: Payer: Medicaid Other | Admitting: Medical

## 2020-03-31 ENCOUNTER — Encounter: Payer: Self-pay | Admitting: Medical

## 2020-12-27 IMAGING — US US MFM OB DETAIL+14 WK
1 series · 13 of 28 positions shown · non-contrast
Comparison: none

[Series 1: us mfm ob detail+14 wk · 91 acquisitions, 13 frames shown]
[im 4/91]
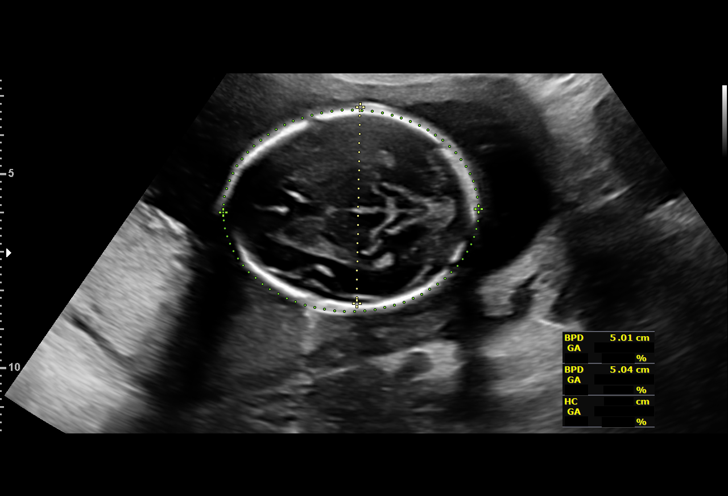
[im 11/91]
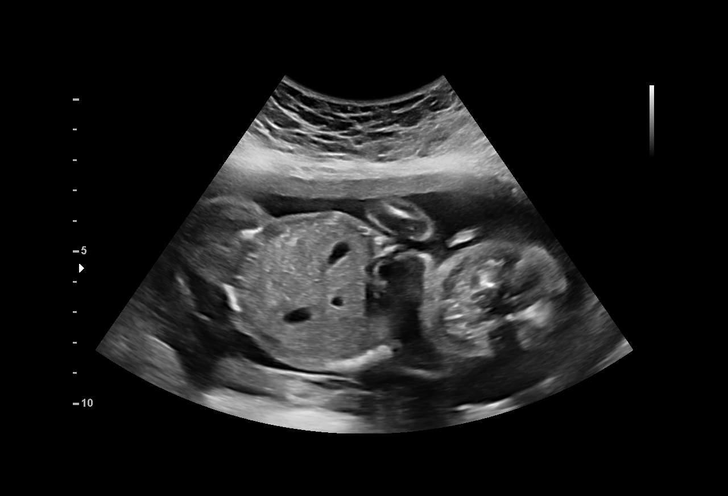
[im 17/91]
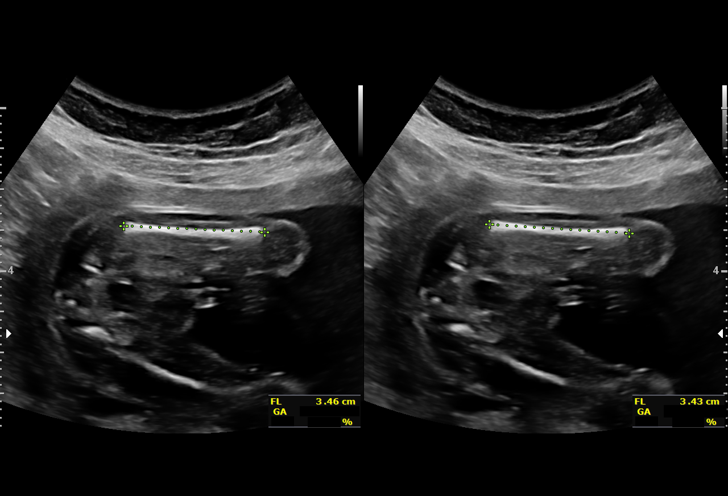
[im 24/91]
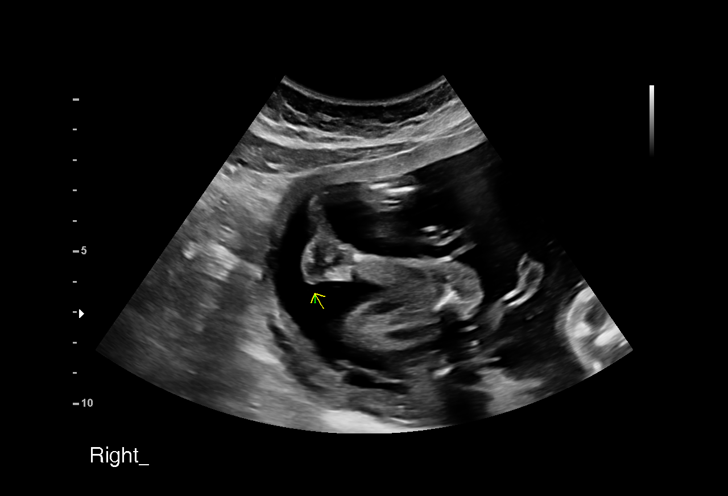
[im 31/91]
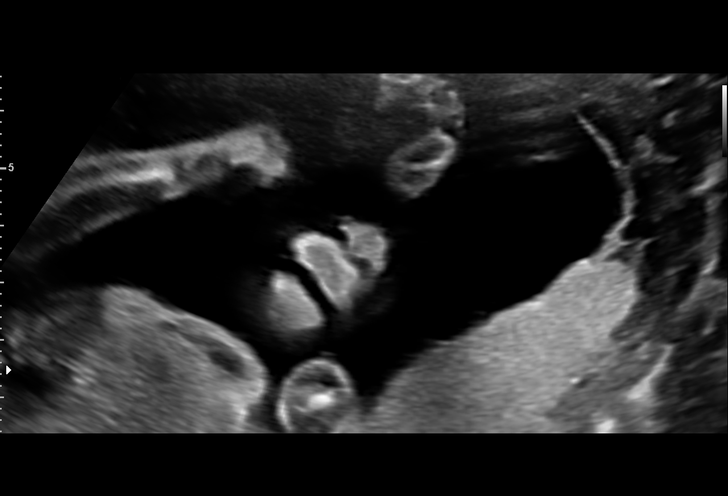
[im 37/91]
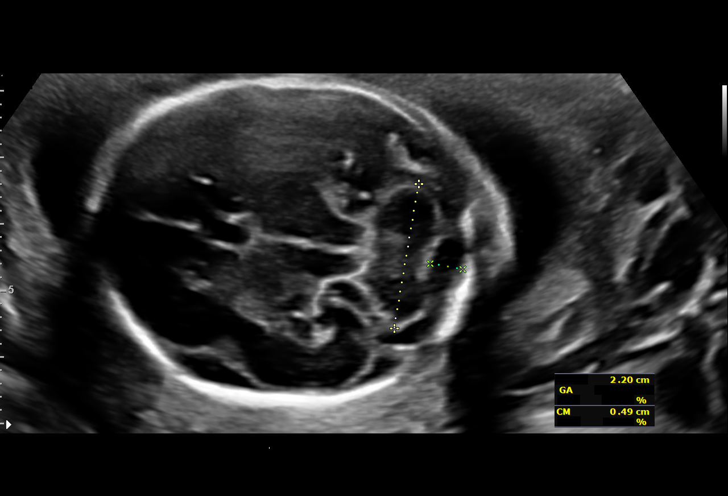
[im 47/91]
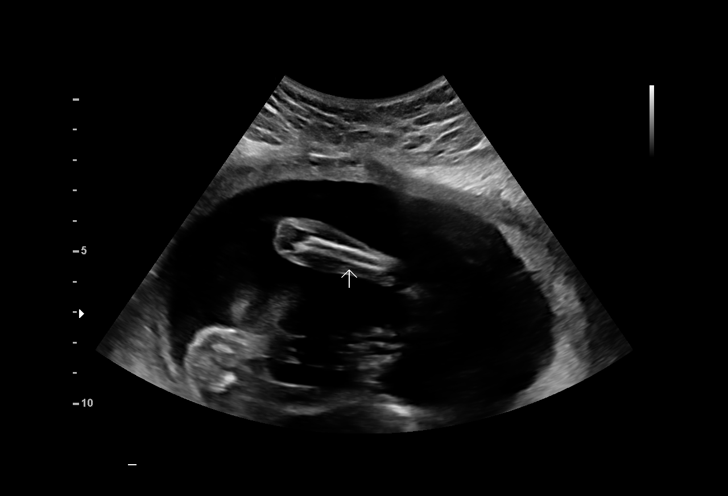
[im 54/91]
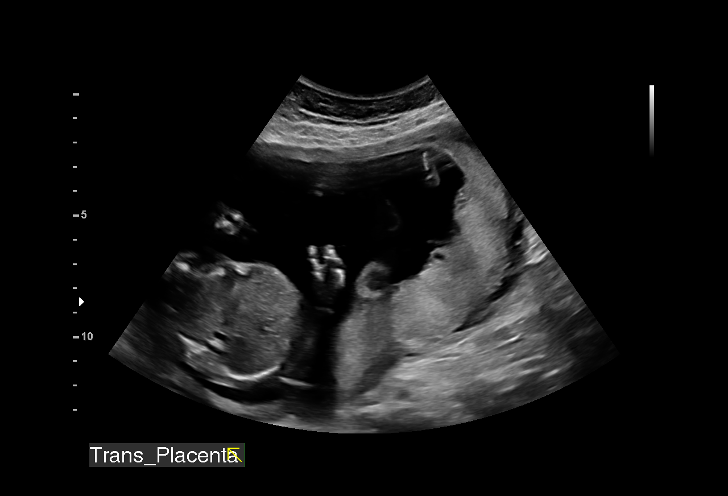
[im 61/91]
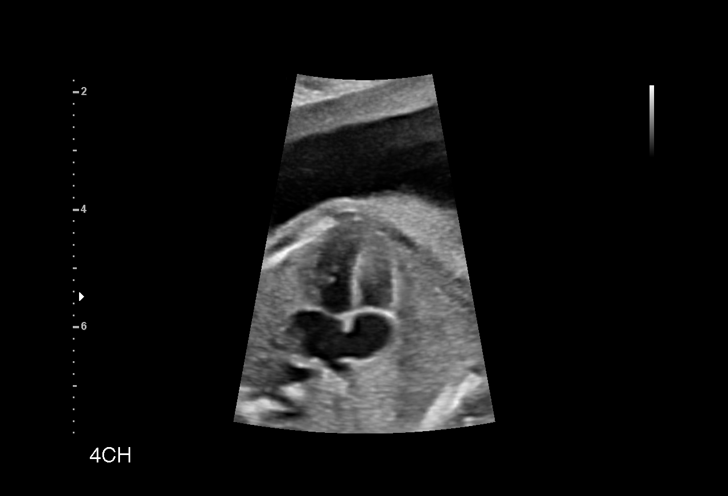
[im 67/91]
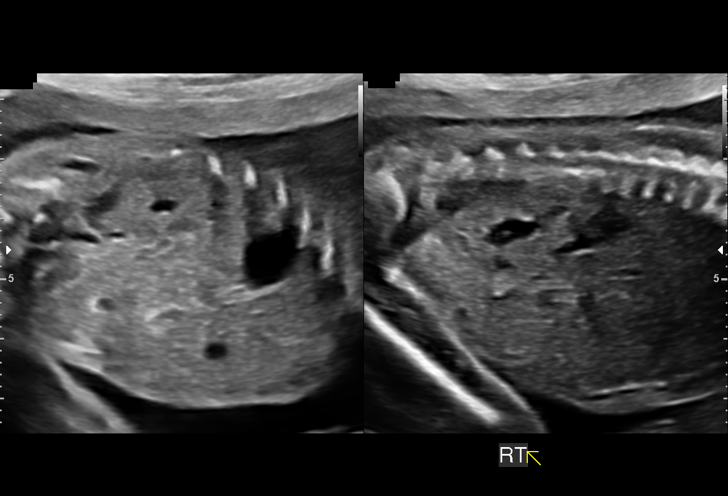
[im 74/91]
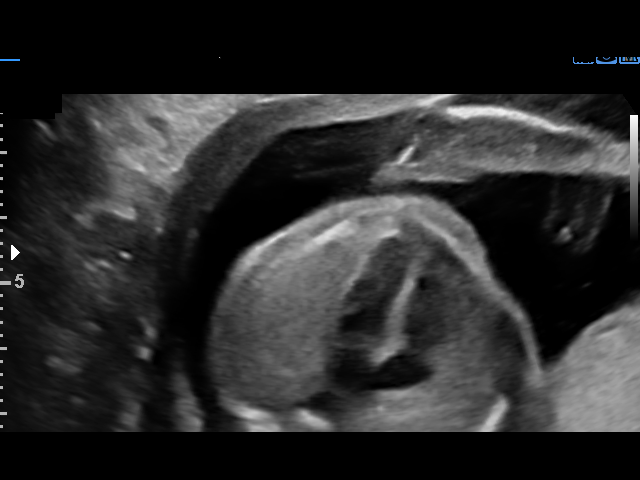
[im 81/91]
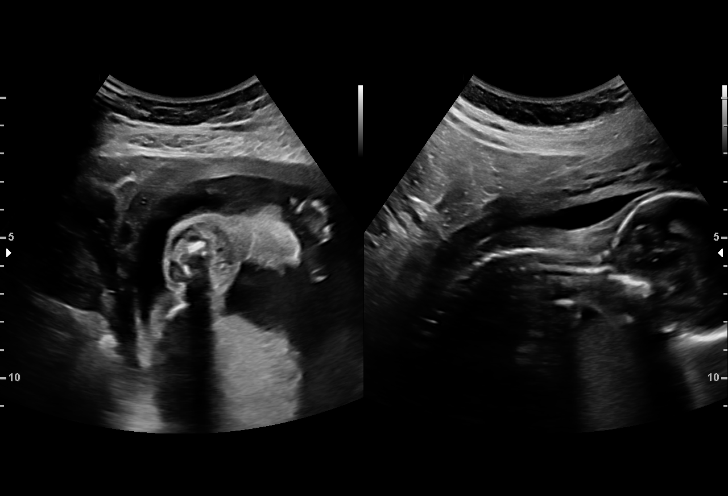
[im 87/91]
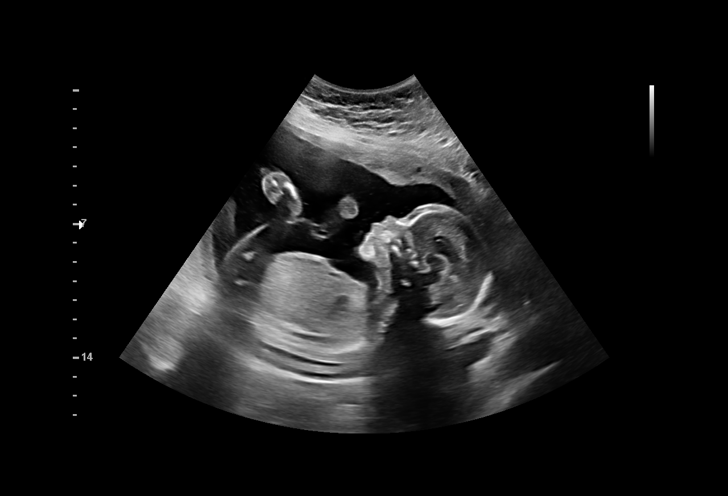

[13 of 28 positions shown; findings below may reference images not displayed]

[REDACTED]. [HOSPITAL],
                   [facility] CNM

  1  US MFM OB DETAIL +14 WK              76811.01     LANG[facility] AN-DRIE
 ----------------------------------------------------------------------

 ----------------------------------------------------------------------
Indications

  Genetic carrier (Zed Kargbo,
  increased carrier risk for SMA)
  Obesity complicating pregnancy, second
  trimester (Pre Pregnancy BMI 31.5)
  21 weeks gestation of pregnancy
  Encounter for antenatal screening for
  malformations
  low risk NIPS, Neg AFP
  Fetal abnormality - other known or
  suspected (EIFLV)
 ----------------------------------------------------------------------
Vital Signs

                                                Height:        5'3"
Fetal Evaluation

 Num Of Fetuses:         1
 Fetal Heart Rate(bpm):  159
 Cardiac Activity:       Observed
 Presentation:           Cephalic
 Placenta:               Posterior
 P. Cord Insertion:      Visualized

 Amniotic Fluid
 AFI FV:      Within normal limits

                             Largest Pocket(cm)

Biometry
 BPD:      49.2  mm     G. Age:  20w 6d         38  %    CI:        73.67   %    70 - 86
                                                         FL/HC:      18.9   %    15.9 -
 HC:      182.1  mm     G. Age:  20w 4d         19  %    HC/AC:      1.09        1.06 -
 AC:      167.2  mm     G. Age:  21w 5d         63  %    FL/BPD:     70.1   %
 FL:       34.5  mm     G. Age:  20w 6d         31  %    FL/AC:      20.6   %    20 - 24
 HUM:      34.7  mm     G. Age:  21w 6d         66  %
 CER:      21.9  mm     G. Age:  20w 6d         42  %

 LV:        5.3  mm
 CM:        4.9  mm

 Est. FW:     410  gm    0 lb 14 oz      50  %
OB History

 Gravidity:    5         Term:   3
 TOP:          1        Living:  3
Gestational Age

 LMP:           21w 1d        Date:  05/24/19                 EDD:   02/28/20
 U/S Today:     21w 0d                                        EDD:   02/29/20
 Best:          21w 1d     Det. By:  LMP  (05/24/19)          EDD:   02/28/20
Anatomy

 Cranium:               Appears normal         LVOT:                   Appears normal
 Cavum:                 Appears normal         Aortic Arch:            Appears normal
 Ventricles:            Appears normal         Ductal Arch:            Appears normal
 Choroid Plexus:        Appears normal         Diaphragm:              Appears normal
 Cerebellum:            Appears normal         Stomach:                Appears normal, left
                                                                       sided
 Posterior Fossa:       Appears normal         Abdomen:                Appears normal
 Nuchal Fold:           Not applicable (>20    Abdominal Wall:         Appears nml (cord
                        wks GA)                                        insert, abd wall)
 Face:                  Appears normal         Cord Vessels:           Appears normal (3
                        (orbits and profile)                           vessel cord)
 Lips:                  Appears normal         Kidneys:                Appear normal
 Palate:                Not well visualized    Bladder:                Appears normal
 Thoracic:              Appears normal         Spine:                  Ltd views no
                                                                       intracranial signs of
                                                                       NTD
 Heart:                 Echogenic focus        Upper Extremities:      Appears normal
                        in LV
 RVOT:                  Appears normal         Lower Extremities:      Appears normal

 Other:  Female gender Heels and 5th digit visualized. Nasal bone visualized.
         Technically difficult due to fetal position and movement.
Cervix Uterus Adnexa

 Cervix
 Length:            4.8  cm.
 Normal appearance by transabdominal scan.
Impression

 We performed fetal anatomy scan. An echogenic intracardiac
 focus is seen. No other makers of aneuploidies or fetal
 structural defects are seen. Fetal biometry is consistent with
 her previously-established dates. Amniotic fluid is normal and
 good fetal activity is seen.

 MSAFP screening showed low risk for open-neural tube
 defects.
 On cell-free fetal DNA screening, the risks of fetal
 aneuploidies are not increased.
 I informed the patient that given that she had Haji Murtaza Hane for fetal
 aneuploidies on cell-free fetal DNA screening, finding of
 echogenic intracardiac focus should be considered a normal
 variant and that the risk of trisomy 21 is not increased. I also
 reassured that echogenic focus does not increase the risk of
 cardiac defects. I also informed her that only amniocentesis
 will give a defintive result on the fetal karyotype.
 Patient opted not to have amniocentesis.

 Patient has an increased carrier risk for Spinal Muscular
 Atrophy.
Recommendations

 -Patient has an appointment to meet with our genetic
 counselor in 2 weeks.
                 Vex, Erdione Da
